# Patient Record
Sex: Female | Born: 1995 | Hispanic: No | Marital: Married | Smoking: Never smoker
Health system: Southern US, Community
[De-identification: ages and names within clinical notes are randomized; demographics above are authoritative.]

## PROBLEM LIST (undated history)

## (undated) DIAGNOSIS — L709 Acne, unspecified: Secondary | ICD-10-CM

## (undated) DIAGNOSIS — D649 Anemia, unspecified: Secondary | ICD-10-CM

## (undated) DIAGNOSIS — J45909 Unspecified asthma, uncomplicated: Secondary | ICD-10-CM

## (undated) DIAGNOSIS — F410 Panic disorder [episodic paroxysmal anxiety] without agoraphobia: Secondary | ICD-10-CM

## (undated) DIAGNOSIS — L309 Dermatitis, unspecified: Secondary | ICD-10-CM

## (undated) HISTORY — DX: Unspecified asthma, uncomplicated: J45.909

## (undated) HISTORY — DX: Panic disorder (episodic paroxysmal anxiety): F41.0

## (undated) HISTORY — DX: Dermatitis, unspecified: L30.9

## (undated) HISTORY — DX: Acne, unspecified: L70.9

---
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
## 2013-07-04 ENCOUNTER — Ambulatory Visit (INDEPENDENT_AMBULATORY_CARE_PROVIDER_SITE_OTHER): Payer: Managed Care, Other (non HMO) | Admitting: Family Medicine

## 2013-07-04 ENCOUNTER — Encounter: Payer: Self-pay | Admitting: Family Medicine

## 2013-07-04 VITALS — BP 115/72 | HR 101 | Temp 99.1°F | Ht 66.0 in | Wt 133.0 lb

## 2013-07-04 DIAGNOSIS — Z87898 Personal history of other specified conditions: Secondary | ICD-10-CM

## 2013-07-04 DIAGNOSIS — N390 Urinary tract infection, site not specified: Secondary | ICD-10-CM

## 2013-07-04 DIAGNOSIS — R350 Frequency of micturition: Secondary | ICD-10-CM

## 2013-07-04 LAB — POCT URINALYSIS DIPSTICK
Bilirubin, UA: NEGATIVE
Glucose, UA: NEGATIVE
Ketones, UA: NEGATIVE
Nitrite, UA: NEGATIVE
Protein, UA: NEGATIVE
Spec Grav, UA: 1.01
Urobilinogen, UA: NEGATIVE
pH, UA: 7

## 2013-07-04 LAB — POCT UA - MICROSCOPIC ONLY
Bacteria, U Microscopic: NEGATIVE
Casts, Ur, LPF, POC: NEGATIVE
Crystals, Ur, HPF, POC: NEGATIVE
Mucus, UA: NEGATIVE
Yeast, UA: NEGATIVE

## 2013-07-04 MED ORDER — CIPROFLOXACIN HCL 500 MG PO TABS: 500.0000 mg | ORAL_TABLET | Freq: Two times a day (BID) | ORAL | Status: DC

## 2013-07-04 NOTE — Progress Notes (Signed)
   Subjective:    Patient ID: Marcianne Swanick, female    DOB: [birth year], 18 y.o.   MRN: [id number]  HPI This 18 y.o. female presents for evaluation of urinary sx's for 2 days.   Review of Systems C/o dysuria   No chest pain, SOB, HA, dizziness, vision change, N/V, diarrhea, constipation,myalgias, arthralgias or rash.  Objective:   Physical Exam Vital signs noted  Well developed well nourished female.  HEENT - Head atraumatic Normocephalic Respiratory - Lungs CTA bilateral Cardiac - RRR S1 and S2 without murmur   Results for orders placed in visit on 07/04/13  POCT URINALYSIS DIPSTICK      Result Value Ref Range   Color, UA yellow     Clarity, UA clear     Glucose, UA neg     Bilirubin, UA neg     Ketones, UA neg     Spec Grav, UA 1.010     Blood, UA trace     pH, UA 7.0     Protein, UA neg     Urobilinogen, UA negative     Nitrite, UA neg     Leukocytes, UA Trace    POCT UA - MICROSCOPIC ONLY      Result Value Ref Range   WBC, Ur, HPF, POC 10-15     RBC, urine, microscopic 1-5     Bacteria, U Microscopic neg     Mucus, UA neg     Epithelial cells, urine per micros occ     Crystals, Ur, HPF, POC neg     Casts, Ur, LPF, POC neg     Yeast, UA neg         Assessment & Plan:  Urinary frequency - Plan: POCT urinalysis dipstick, POCT UA - Microscopic Only, Urine culture, ciprofloxacin (CIPRO) 500 MG tablet  History of painful urination - Plan: POCT urinalysis dipstick, POCT UA - Microscopic Only, Urine culture, ciprofloxacin (CIPRO) 500 MG tablet  UTI (urinary tract infection) - Plan: ciprofloxacin (CIPRO) 500 MG tablet  Push po fluids, rest, tylenol and motrin otc prn as directed for fever, arthralgias, and myalgias.  Follow up prn if sx's continue or persist.  Deatra Canter FNP

## 2013-07-06 LAB — URINE CULTURE

## 2013-09-03 ENCOUNTER — Encounter: Payer: Self-pay | Admitting: Nurse Practitioner

## 2013-09-03 ENCOUNTER — Ambulatory Visit (INDEPENDENT_AMBULATORY_CARE_PROVIDER_SITE_OTHER): Payer: Managed Care, Other (non HMO) | Admitting: Nurse Practitioner

## 2013-09-03 ENCOUNTER — Telehealth: Payer: Self-pay | Admitting: Family Medicine

## 2013-09-03 VITALS — BP 127/82 | HR 98 | Temp 98.6°F | Ht 66.0 in | Wt 132.8 lb

## 2013-09-03 DIAGNOSIS — J029 Acute pharyngitis, unspecified: Secondary | ICD-10-CM

## 2013-09-03 LAB — POCT RAPID STREP A (OFFICE): RAPID STREP A SCREEN: NEGATIVE

## 2013-09-03 MED ORDER — AMOXICILLIN 875 MG PO TABS: 875.0000 mg | ORAL_TABLET | Freq: Two times a day (BID) | ORAL | Status: DC

## 2013-09-03 NOTE — Patient Instructions (Signed)
Force fluids °Motrin or tylenol OTC °OTC decongestant °Throat lozenges if help °New toothbrush in 3 days ° °

## 2013-09-03 NOTE — Telephone Encounter (Signed)
Appt given for today per patients request 

## 2013-09-03 NOTE — Progress Notes (Signed)
   Subjective:    Patient ID: Julie Zimmerman, female    DOB: 03-Sep-1995, 18 y.o.   MRN: [id number]  HPI  Patient in C/O headache that started yesterday afternoon- then she developed body aches and sore throat last night.    Review of Systems  Constitutional: Positive for fever (last night).  HENT: Positive for sore throat, trouble swallowing and voice change.   Respiratory: Negative for cough.   Cardiovascular: Negative.   Gastrointestinal: Positive for nausea.  Musculoskeletal: Negative.   Neurological: Positive for headaches.       Objective:   Physical Exam  Constitutional: She is oriented to person, place, and time. She appears well-developed and well-nourished. No distress.  HENT:  Right Ear: Hearing, tympanic membrane, external ear and ear canal normal.  Left Ear: Hearing, tympanic membrane, external ear and ear canal normal.  Nose: Mucosal edema and rhinorrhea present. Right sinus exhibits no maxillary sinus tenderness and no frontal sinus tenderness. Left sinus exhibits no maxillary sinus tenderness and no frontal sinus tenderness.  Mouth/Throat: Uvula is midline and mucous membranes are normal. Posterior oropharyngeal edema and posterior oropharyngeal erythema (mild) present.  Neck: Normal range of motion.  Cardiovascular: Normal rate, regular rhythm and normal heart sounds.   Pulmonary/Chest: Effort normal and breath sounds normal.  Lymphadenopathy:    She has no cervical adenopathy.  Neurological: She is alert and oriented to person, place, and time.  Skin: Skin is warm and dry.  Psychiatric: She has a normal mood and affect. Her behavior is normal. Judgment and thought content normal.   BP 127/82  Pulse 98  Temp(Src) 98.6 F (37 C) (Oral)  Ht 5\' 6"  (1.676 m)  Wt 132 lb 12.8 oz (60.238 kg)  BMI 21.44 kg/m2  Results for orders placed in visit on 09/03/13  POCT RAPID STREP A (OFFICE)      Result Value Ref Range   Rapid Strep A Screen Negative  Negative           Assessment & Plan:   1. Sore throat   2. Acute pharyngitis, unspecified pharyngitis type    Meds ordered this encounter  Medications  . amoxicillin (AMOXIL) 875 MG tablet    Sig: Take 1 tablet (875 mg total) by mouth 2 (two) times daily.    Dispense:  20 tablet    Refill:  0    Order Specific Question:  Supervising Provider    Answer:  Deborra MedinaMOORE, DONALD W [[id number]]   Force fluids Motrin or tylenol OTC OTC decongestant Throat lozenges if help New toothbrush in 3 days  Mary-Margaret Daphine DeutscherMartin, FNP

## 2013-11-10 ENCOUNTER — Other Ambulatory Visit: Payer: Self-pay | Admitting: Obstetrics & Gynecology

## 2013-12-08 ENCOUNTER — Encounter: Payer: Self-pay | Admitting: Nurse Practitioner

## 2013-12-09 ENCOUNTER — Ambulatory Visit (INDEPENDENT_AMBULATORY_CARE_PROVIDER_SITE_OTHER): Payer: Managed Care, Other (non HMO) | Admitting: Obstetrics & Gynecology

## 2013-12-09 ENCOUNTER — Encounter: Payer: Self-pay | Admitting: Obstetrics & Gynecology

## 2013-12-09 VITALS — BP 122/80 | Ht 67.0 in | Wt 134.0 lb

## 2013-12-09 DIAGNOSIS — L659 Nonscarring hair loss, unspecified: Secondary | ICD-10-CM

## 2013-12-09 DIAGNOSIS — Z01419 Encounter for gynecological examination (general) (routine) without abnormal findings: Secondary | ICD-10-CM

## 2013-12-09 NOTE — Progress Notes (Signed)
Patient ID: Julie Zimmerman, female   DOB: 1995-07-04, 18 y.o.   MRN: [id number] Blood pressure 122/80, height 5\' 7"  (1.702 m), weight 134 lb (60.782 kg), last menstrual period 12/09/2013. Pt with increased hair loss over the past 6-8 months Dramatically changed in that time Has been on this progesterone for some time No hair products issue or straightening  No hot cold intolerance Increased amount of life stressors over the past 6 months or so  Will check TSH have her stop her OCP for now to see response  increased biotin and MVI

## 2013-12-10 LAB — TSH: TSH: 1.052 u[IU]/mL (ref 0.350–4.500)

## 2014-05-26 ENCOUNTER — Ambulatory Visit (INDEPENDENT_AMBULATORY_CARE_PROVIDER_SITE_OTHER): Payer: Managed Care, Other (non HMO) | Admitting: Obstetrics & Gynecology

## 2014-05-26 ENCOUNTER — Encounter: Payer: Self-pay | Admitting: Obstetrics & Gynecology

## 2014-05-26 VITALS — BP 110/70 | HR 72 | Wt 139.0 lb

## 2014-05-26 DIAGNOSIS — L659 Nonscarring hair loss, unspecified: Secondary | ICD-10-CM | POA: Diagnosis not present

## 2014-05-26 MED ORDER — DESOGESTREL-ETHINYL ESTRADIOL 0.15-30 MG-MCG PO TABS: 1.0000 | ORAL_TABLET | Freq: Every day | ORAL | Status: DC

## 2014-05-26 NOTE — Progress Notes (Signed)
Patient ID: Genia Haroldllison Naraine, female   DOB: [birth year], 19 y.o.   MRN: [id number] Pt still having issues with excessive hair loss, stopped OCP which has not been of help TSH was normal, reviewed Taking biotin and a MVI as prescribed  Acne worse off OCP, wants to restart  Exam Blood pressure 110/70, pulse 72, weight 139 lb (63.05 kg), last menstrual period 05/17/2014.  Hair no lesions or skin changes mild dandruff changes Back with mild acne  Will restart her desogestrel with 30 micrograms EE  Follow up for yearly as scheduled     Face to face time:  15 minutes  Greater than 50% of the visit time was spent in counseling and coordination of care with the patient.  The summary and outline of the counseling and care coordination is summarized in the note above.   All questions were answered.

## 2014-06-25 ENCOUNTER — Telehealth: Payer: Self-pay | Admitting: Obstetrics & Gynecology

## 2014-06-25 NOTE — Telephone Encounter (Signed)
Pt states been on OCP x 2 weeks now having vaginal bleeding. Informed pt that you can have irregular bleeding anytime you start a new birth control or switch birth control. Pt states "really concerned she might be pregnant."  Pt advised to take an OTC pregnancy test if positive pt can [facility]. Pt verbalized understanding.

## 2014-09-07 ENCOUNTER — Other Ambulatory Visit: Payer: Managed Care, Other (non HMO) | Admitting: Adult Health

## 2015-05-03 ENCOUNTER — Other Ambulatory Visit (HOSPITAL_COMMUNITY)
Admission: RE | Admit: 2015-05-03 | Discharge: 2015-05-03 | Disposition: A | Discharge status: Home / Self Care | Payer: Managed Care, Other (non HMO) | Source: Ambulatory Visit | Attending: Obstetrics & Gynecology | Admitting: Obstetrics & Gynecology

## 2015-05-03 ENCOUNTER — Ambulatory Visit (INDEPENDENT_AMBULATORY_CARE_PROVIDER_SITE_OTHER): Payer: Managed Care, Other (non HMO) | Admitting: Obstetrics & Gynecology

## 2015-05-03 ENCOUNTER — Encounter: Payer: Self-pay | Admitting: Obstetrics & Gynecology

## 2015-05-03 VITALS — BP 150/92 | HR 86 | Ht 65.5 in | Wt 129.5 lb

## 2015-05-03 DIAGNOSIS — N390 Urinary tract infection, site not specified: Secondary | ICD-10-CM | POA: Diagnosis not present

## 2015-05-03 DIAGNOSIS — R300 Dysuria: Secondary | ICD-10-CM

## 2015-05-03 DIAGNOSIS — N941 Unspecified dyspareunia: Secondary | ICD-10-CM

## 2015-05-03 DIAGNOSIS — N760 Acute vaginitis: Secondary | ICD-10-CM

## 2015-05-03 DIAGNOSIS — Z01419 Encounter for gynecological examination (general) (routine) without abnormal findings: Secondary | ICD-10-CM

## 2015-05-03 DIAGNOSIS — B9689 Other specified bacterial agents as the cause of diseases classified elsewhere: Secondary | ICD-10-CM

## 2015-05-03 DIAGNOSIS — N942 Vaginismus: Secondary | ICD-10-CM

## 2015-05-03 LAB — POCT URINALYSIS DIPSTICK
Glucose, UA: NEGATIVE
Ketones, UA: NEGATIVE
Nitrite, UA: NEGATIVE
PROTEIN UA: NEGATIVE

## 2015-05-03 MED ORDER — METRONIDAZOLE 0.75 % VA GEL: VAGINAL | Status: DC

## 2015-05-03 MED ORDER — SULFAMETHOXAZOLE-TRIMETHOPRIM 800-160 MG PO TABS: 1.0000 | ORAL_TABLET | Freq: Two times a day (BID) | ORAL | Status: DC

## 2015-05-03 MED ORDER — NORETHIN ACE-ETH ESTRAD-FE 1-20 MG-MCG(24) PO TABS: 1.0000 | ORAL_TABLET | Freq: Every day | ORAL | Status: DC

## 2015-05-03 NOTE — Progress Notes (Signed)
Patient ID: Julie Zimmerman, female   DOB: 14-Jan-1996, 20 y.o.   MRN: [id number] Subjective:     Julie Haroldllison Laing is a 20 y.o. female here for a routine exam.  Patient's last menstrual period was 04/28/2015. G1P1001 Birth Control Method:  none Menstrual Calendar(currently): none  Current complaints: dysuria, vaginal odor.   Current acute medical issues:  none   Recent Gynecologic History Patient's last menstrual period was 04/28/2015. Last Pap: never,   Last mammogram: ,    Past Medical History  Diagnosis Date  . Asthma   . Eczema   . Acne     Past Surgical History  Procedure Laterality Date  . Cesarean section  11/05/2010    Procedure: CESAREAN SECTION;  Surgeon: Hollie SalkMyra C. Marice Potterove, MD;  Location: [facility];  Service: Gynecology;  Laterality: N/A;  Primary cesarean section with delivery of baby boy at [id number]. Apgars 6/9.    OB History    Gravida Para Term Preterm AB TAB SAB Ectopic Multiple Living   1 1 1       1       Social History   Social History  . Marital Status: Married    Spouse Name: N/A  . Number of Children: N/A  . Years of Education: N/A   Social History Main Topics  . Smoking status: Never Smoker   . Smokeless tobacco: Never Used  . Alcohol Use: No  . Drug Use: No  . Sexual Activity: Yes    Birth Control/ Protection: None   Other Topics Concern  . None   Social History Narrative    Family History  Problem Relation Age of Onset  . Cancer Other     great gm breast  . Heart disease Father   . Cancer Paternal Uncle     colon  . Heart disease Maternal Grandfather   . Stroke Paternal Grandfather   . Other Maternal Grandmother     died in MVA     Current outpatient prescriptions:  .  Adapalene 0.3 % gel, Apply topically at bedtime. , Disp: , Rfl:  .  Azelaic Acid (FINACEA) 15 % cream, Apply topically at bedtime. After skin is thoroughly washed and patted dry, gently but thoroughly massage a thin film of azelaic acid cream into the affected area twice  daily, in the morning and evening., Disp: , Rfl:  .  tacrolimus (PROTOPIC) 0.1 % ointment, Apply topically as needed. , Disp: , Rfl:  .  metroNIDAZOLE (METROGEL VAGINAL) 0.75 % vaginal gel, Nightly x 5 nights, Disp: 70 g, Rfl: 0 .  sulfamethoxazole-trimethoprim (BACTRIM DS) 800-160 MG tablet, Take 1 tablet by mouth 2 (two) times daily., Disp: 14 tablet, Rfl: 0  Review of Systems  Review of Systems  Constitutional: Negative for fever, chills, weight loss, malaise/fatigue and diaphoresis.  HENT: Negative for hearing loss, ear pain, nosebleeds, congestion, sore throat, neck pain, tinnitus and ear discharge.   Eyes: Negative for blurred vision, double vision, photophobia, pain, discharge and redness.  Respiratory: Negative for cough, hemoptysis, sputum production, shortness of breath, wheezing and stridor.   Cardiovascular: Negative for chest pain, palpitations, orthopnea, claudication, leg swelling and PND.  Gastrointestinal: negative for abdominal pain. Negative for heartburn, nausea, vomiting, diarrhea, constipation, blood in stool and melena.  Genitourinary: Negative for dysuria, urgency, frequency, hematuria and flank pain.  Musculoskeletal: Negative for myalgias, back pain, joint pain and falls.  Skin: Negative for itching and rash.  Neurological: Negative for dizziness, tingling, tremors, sensory change, speech change, focal weakness, seizures,  loss of consciousness, weakness and headaches.  Endo/Heme/Allergies: Negative for environmental allergies and polydipsia. Does not bruise/bleed easily.  Psychiatric/Behavioral: Negative for depression, suicidal ideas, hallucinations, memory loss and substance abuse. The patient is not nervous/anxious and does not have insomnia.        Objective:  Blood pressure 150/92, pulse 86, height 5' 5.5" (1.664 m), weight 129 lb 8 oz (58.741 kg), last menstrual period 04/28/2015.   Physical Exam  Vitals reviewed. Constitutional: She is oriented to person,  place, and time. She appears well-developed and well-nourished.  HENT:  Head: Normocephalic and atraumatic.        Right Ear: External ear normal.  Left Ear: External ear normal.  Nose: Nose normal.  Mouth/Throat: Oropharynx is clear and moist.  Eyes: Conjunctivae and EOM are normal. Pupils are equal, round, and reactive to light. Right eye exhibits no discharge. Left eye exhibits no discharge. No scleral icterus.  Neck: Normal range of motion. Neck supple. No tracheal deviation present. No thyromegaly present.  Cardiovascular: Normal rate, regular rhythm, normal heart sounds and intact distal pulses.  Exam reveals no gallop and no friction rub.   No murmur heard. Respiratory: Effort normal and breath sounds normal. No respiratory distress. She has no wheezes. She has no rales. She exhibits no tenderness.  GI: Soft. Bowel sounds are normal. She exhibits no distension and no mass. There is no tenderness. There is no rebound and no guarding.  Genitourinary:  Breasts no masses skin changes or nipple changes bilaterally      Vulva is normal without lesions Vagina is pink moist without discharge Cervix normal in appearance and pap is done Uterus is normal size shape and contour Adnexa is negative with normal sized ovaries   Musculoskeletal: Normal range of motion. She exhibits no edema and no tenderness.  Neurological: She is alert and oriented to person, place, and time. She has normal reflexes. She displays normal reflexes. No cranial nerve deficit. She exhibits normal muscle tone. Coordination normal.  Skin: Skin is warm and dry. No rash noted. No erythema. No pallor.  Psychiatric: She has a normal mood and affect. Her behavior is normal. Judgment and thought content normal.       Assessment:    Healthy female exam.    Plan:    Contraception: none. Follow up in: 3 weeks. re evaluate BV and UTI    Meds ordered this encounter  Medications  . Adapalene 0.3 % gel    Sig: Apply  topically at bedtime.   . tacrolimus (PROTOPIC) 0.1 % ointment    Sig: Apply topically as needed.   . sulfamethoxazole-trimethoprim (BACTRIM DS) 800-160 MG tablet    Sig: Take 1 tablet by mouth 2 (two) times daily.    Dispense:  14 tablet    Refill:  0  . metroNIDAZOLE (METROGEL VAGINAL) 0.75 % vaginal gel    Sig: Nightly x 5 nights    Dispense:  70 g    Refill:  0    Orders Placed This Encounter  Procedures  . Urine culture  . POCT Urinalysis Dipstick

## 2015-05-04 LAB — CYTOLOGY - PAP

## 2015-05-05 LAB — URINE CULTURE

## 2015-05-24 ENCOUNTER — Ambulatory Visit: Payer: Managed Care, Other (non HMO) | Admitting: Obstetrics & Gynecology

## 2015-05-31 ENCOUNTER — Ambulatory Visit (INDEPENDENT_AMBULATORY_CARE_PROVIDER_SITE_OTHER): Payer: Managed Care, Other (non HMO) | Admitting: Obstetrics & Gynecology

## 2015-05-31 ENCOUNTER — Encounter: Payer: Self-pay | Admitting: Obstetrics & Gynecology

## 2015-05-31 VITALS — BP 110/70 | HR 84 | Ht 65.2 in | Wt 136.3 lb

## 2015-05-31 DIAGNOSIS — R809 Proteinuria, unspecified: Secondary | ICD-10-CM

## 2015-05-31 DIAGNOSIS — A499 Bacterial infection, unspecified: Secondary | ICD-10-CM

## 2015-05-31 DIAGNOSIS — N760 Acute vaginitis: Secondary | ICD-10-CM

## 2015-05-31 DIAGNOSIS — B9689 Other specified bacterial agents as the cause of diseases classified elsewhere: Secondary | ICD-10-CM

## 2015-05-31 LAB — POCT URINALYSIS DIPSTICK
Glucose, UA: NEGATIVE
KETONES UA: NEGATIVE
LEUKOCYTES UA: NEGATIVE
Nitrite, UA: NEGATIVE
RBC UA: NEGATIVE

## 2015-05-31 NOTE — Progress Notes (Signed)
Patient ID: Julie Zimmerman, female   DOB: 1995/10/09, 20 y.o.   MRN: [id number] Follow up appointment for results  Chief Complaint  Patient presents with  . Follow-up    treated UTI    Blood pressure 110/70, pulse 84, height 5' 5.2" (1.656 m), weight 136 lb 4.8 oz (61.825 kg), last menstrual period 05/26/2015.  Pt in for follow up from her appointment 3 weeks ago with BV and UTI Took the bactrim DS and metro gel with complete resolution of her symptoms Also her acne is muchh better on the adapalene 0.3%  Overall reolution and good management of all of her symp[toms  MEDS ordered this encounter: No orders of the defined types were placed in this encounter.    Orders for this encounter: Orders Placed This Encounter  Procedures  . POCT urinalysis dipstick    Plan:  Follow Up:     Face to face time:  10 minutes  Greater than 50% of the visit time was spent in counseling and coordination of care with the patient.  The summary and outline of the counseling and care coordination is summarized in the note above.   All questions were answered.  Past Medical History  Diagnosis Date  . Asthma   . Eczema   . Acne     Past Surgical History  Procedure Laterality Date  . Cesarean section  11/05/2010    Procedure: CESAREAN SECTION;  Surgeon: Hollie SalkMyra C. Marice Potterove, MD;  Location: [facility];  Service: Gynecology;  Laterality: N/A;  Primary cesarean section with delivery of baby boy at [id number]. Apgars 6/9.    OB History    Gravida Para Term Preterm AB TAB SAB Ectopic Multiple Living   1 1 1       1       Allergies  Allergen Reactions  . Scopolamine     Social History   Social History  . Marital Status: Married    Spouse Name: N/A  . Number of Children: N/A  . Years of Education: N/A   Social History Main Topics  . Smoking status: Never Smoker   . Smokeless tobacco: Never Used  . Alcohol Use: No  . Drug Use: No  . Sexual Activity: Yes    Birth Control/ Protection: None    Other Topics Concern  . None   Social History Narrative    Family History  Problem Relation Age of Onset  . Cancer Other     great gm breast  . Heart disease Father   . Cancer Paternal Uncle     colon  . Heart disease Maternal Grandfather   . Stroke Paternal Grandfather   . Other Maternal Grandmother     died in MVA

## 2016-05-29 ENCOUNTER — Other Ambulatory Visit: Payer: Self-pay | Admitting: Obstetrics & Gynecology

## 2016-06-06 ENCOUNTER — Ambulatory Visit: Payer: Managed Care, Other (non HMO) | Admitting: Obstetrics & Gynecology

## 2016-06-19 ENCOUNTER — Ambulatory Visit (INDEPENDENT_AMBULATORY_CARE_PROVIDER_SITE_OTHER): Payer: Managed Care, Other (non HMO) | Admitting: Obstetrics & Gynecology

## 2016-06-19 ENCOUNTER — Encounter: Payer: Self-pay | Admitting: Obstetrics & Gynecology

## 2016-06-19 ENCOUNTER — Encounter (INDEPENDENT_AMBULATORY_CARE_PROVIDER_SITE_OTHER): Payer: Self-pay

## 2016-06-19 VITALS — BP 124/86 | HR 69 | Wt 138.0 lb

## 2016-06-19 DIAGNOSIS — Z3041 Encounter for surveillance of contraceptive pills: Secondary | ICD-10-CM | POA: Diagnosis not present

## 2016-06-19 MED ORDER — NORETHIN ACE-ETH ESTRAD-FE 1-20 MG-MCG PO TABS: 1.0000 | ORAL_TABLET | Freq: Every day | ORAL | 12 refills | Status: DC
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
## 2016-10-13 ENCOUNTER — Ambulatory Visit: Payer: Managed Care, Other (non HMO) | Admitting: Adult Health

## 2016-10-26 ENCOUNTER — Ambulatory Visit: Payer: 59 | Admitting: Family Medicine

## 2016-10-27 ENCOUNTER — Encounter: Payer: Self-pay | Admitting: Family

## 2016-10-27 ENCOUNTER — Ambulatory Visit (INDEPENDENT_AMBULATORY_CARE_PROVIDER_SITE_OTHER): Payer: 59 | Admitting: Family

## 2016-10-27 VITALS — BP 131/76 | HR 80 | Temp 99.7°F | Ht 65.0 in | Wt 139.4 lb

## 2016-10-27 DIAGNOSIS — Z3A09 9 weeks gestation of pregnancy: Secondary | ICD-10-CM

## 2016-10-27 DIAGNOSIS — Z3201 Encounter for pregnancy test, result positive: Secondary | ICD-10-CM | POA: Diagnosis not present

## 2016-10-27 DIAGNOSIS — N912 Amenorrhea, unspecified: Secondary | ICD-10-CM

## 2016-10-27 LAB — PREGNANCY, URINE: Preg Test, Ur: POSITIVE — AB

## 2016-10-27 NOTE — Progress Notes (Signed)
   Subjective:    Patient ID: Julie Zimmerman, female    DOB: January 28, 1996, 21 y.o.   MRN: [id number]   HPI Pt presents to the office today to establish care and referral to OB/GYN. PT states her last menstrual cycle was around 08/20/16 and had one day of mild spotting around 09/11/16. Pt has positive pregnancy at home on 09/25/16.    Review of Systems  All other systems reviewed and are negative.   Family History  Problem Relation Age of Onset  . Cancer Other        great gm breast  . Heart disease Father   . Cancer Paternal Uncle        colon  . Heart disease Maternal Grandfather   . Stroke Paternal Grandfather   . Other Maternal Grandmother        died in MVA    Social History   Social History  . Marital status: Married    Spouse name: N/A  . Number of children: N/A  . Years of education: N/A   Social History Main Topics  . Smoking status: Never Smoker  . Smokeless tobacco: Never Used  . Alcohol use No  . Drug use: No  . Sexual activity: Yes    Birth control/ protection: Pill   Other Topics Concern  . None   Social History Narrative  . None       Objective:   Physical Exam  Constitutional: She is oriented to person, place, and time. She appears well-developed and well-nourished. No distress.  HENT:  Head: Normocephalic and atraumatic.  Right Ear: External ear normal.  Left Ear: External ear normal.  Nose: Nose normal.  Mouth/Throat: Oropharynx is clear and moist.  Eyes: Pupils are equal, round, and reactive to light.  Neck: Normal range of motion. Neck supple. No thyromegaly present.  Cardiovascular: Normal rate, regular rhythm and intact distal pulses.   Murmur heard. Pulmonary/Chest: Effort normal and breath sounds normal. No respiratory distress. She has no wheezes.  Abdominal: Soft. Bowel sounds are normal. She exhibits no distension. There is no tenderness.  Musculoskeletal: Normal range of motion. She exhibits no edema or tenderness.    Neurological: She is alert and oriented to person, place, and time.  Skin: Skin is warm and dry.  Psychiatric: She has a normal mood and affect. Her behavior is normal. Judgment and thought content normal.  Vitals reviewed.     BP 131/76   Pulse 80   Temp 99.7 F (37.6 C) (Oral)   Ht  (1.651 m)   Wt 139 lb 6.4 oz (63.2 kg)   BMI 23.20 kg/m      Assessment & Plan:  1. Amenorrhea - Pregnancy, urine - Ambulatory referral to Obstetrics / Gynecology  2. Positive pregnancy test - Ambulatory referral to Obstetrics / Gynecology  Referral placed for OB/GYn No OTC Force fluids, importance of staying hydrated Start prenatals   Jannifer Rodney, [place]

## 2016-10-27 NOTE — Patient Instructions (Addendum)
About 9 weeks and 5 days, approx due date: May 27, 2017    First Trimester of Pregnancy The first trimester of pregnancy is from week 1 until the end of week 13 (months 1 through 3). A week after a sperm fertilizes an egg, the egg will implant on the wall of the uterus. This embryo will begin to develop into a baby. Genes from you and your partner will form the baby. The female genes will determine whether the baby will be a boy or a girl. At 6-8 weeks, the eyes and face will be formed, and the heartbeat can be seen on ultrasound. At the end of 12 weeks, all the baby's organs will be formed. Now that you are pregnant, you will want to do everything you can to have a healthy baby. Two of the most important things are to get good prenatal care and to follow your health care provider's instructions. Prenatal care is all the medical care you receive before the baby's birth. This care will help prevent, find, and treat any problems during the pregnancy and childbirth. Body changes during your first trimester Your body goes through many changes during pregnancy. The changes vary from woman to woman.  You may gain or lose a couple of pounds at first.  You may feel sick to your stomach (nauseous) and you may throw up (vomit). If the vomiting is uncontrollable, call your health care provider.  You may tire easily.  You may develop headaches that can be relieved by medicines. All medicines should be approved by your health care provider.  You may urinate more often. Painful urination may mean you have a bladder infection.  You may develop heartburn as a result of your pregnancy.  You may develop constipation because certain hormones are causing the muscles that push stool through your intestines to slow down.  You may develop hemorrhoids or swollen veins (varicose veins).  Your breasts may begin to grow larger and become tender. Your nipples may stick out more, and the tissue that surrounds them  (areola) may become darker.  Your gums may bleed and may be sensitive to brushing and flossing.  Dark spots or blotches (chloasma, mask of pregnancy) may develop on your face. This will likely fade after the baby is born.  Your menstrual periods will stop.  You may have a loss of appetite.  You may develop cravings for certain kinds of food.  You may have changes in your emotions from day to day, such as being excited to be pregnant or being concerned that something may go wrong with the pregnancy and baby.  You may have more vivid and strange dreams.  You may have changes in your hair. These can include thickening of your hair, rapid growth, and changes in texture. Some women also have hair loss during or after pregnancy, or hair that feels dry or thin. Your hair will most likely return to normal after your baby is born.  What to expect at prenatal visits During a routine prenatal visit:  You will be weighed to make sure you and the baby are growing normally.  Your blood pressure will be taken.  Your abdomen will be measured to track your baby's growth.  The fetal heartbeat will be listened to between weeks 10 and 14 of your pregnancy.  Test results from any previous visits will be discussed.  Your health care provider may ask you:  How you are feeling.  If you are feeling the baby move.  If you have had any abnormal symptoms, such as leaking fluid, bleeding, severe headaches, or abdominal cramping.  If you are using any tobacco products, including cigarettes, chewing tobacco, and electronic cigarettes.  If you have any questions.  Other tests that may be performed during your first trimester include:  Blood tests to find your blood type and to check for the presence of any previous infections. The tests will also be used to check for low iron levels (anemia) and protein on red blood cells (Rh antibodies). Depending on your risk factors, or if you previously had diabetes  during pregnancy, you may have tests to check for high blood sugar that affects pregnant women (gestational diabetes).  Urine tests to check for infections, diabetes, or protein in the urine.  An ultrasound to confirm the proper growth and development of the baby.  Fetal screens for spinal cord problems (spina bifida) and Down syndrome.  HIV (human immunodeficiency virus) testing. Routine prenatal testing includes screening for HIV, unless you choose not to have this test.  You may need other tests to make sure you and the baby are doing well.  Follow these instructions at home: Medicines  Follow your health care provider's instructions regarding medicine use. Specific medicines may be either safe or unsafe to take during pregnancy.  Take a prenatal vitamin that contains at least 600 micrograms (mcg) of folic acid.  If you develop constipation, try taking a stool softener if your health care provider approves. Eating and drinking  Eat a balanced diet that includes fresh fruits and vegetables, whole grains, good sources of protein such as meat, eggs, or tofu, and low-fat dairy. Your health care provider will help you determine the amount of weight gain that is right for you.  Avoid raw meat and uncooked cheese. These carry germs that can cause birth defects in the baby.  Eating four or five small meals rather than three large meals a day may help relieve nausea and vomiting. If you start to feel nauseous, eating a few soda crackers can be helpful. Drinking liquids between meals, instead of during meals, also seems to help ease nausea and vomiting.  Limit foods that are high in fat and processed sugars, such as fried and sweet foods.  To prevent constipation: ? Eat foods that are high in fiber, such as fresh fruits and vegetables, whole grains, and beans. ? Drink enough fluid to keep your urine clear or pale yellow. Activity  Exercise only as directed by your health care provider.  Most women can continue their usual exercise routine during pregnancy. Try to exercise for 30 minutes at least 5 days a week. Exercising will help you: ? Control your weight. ? Stay in shape. ? Be prepared for labor and delivery.  Experiencing pain or cramping in the lower abdomen or lower back is a good sign that you should stop exercising. Check with your health care provider before continuing with normal exercises.  Try to avoid standing for long periods of time. Move your legs often if you must stand in one place for a long time.  Avoid heavy lifting.  Wear low-heeled shoes and practice good posture.  You may continue to have sex unless your health care provider tells you not to. Relieving pain and discomfort  Wear a good support bra to relieve breast tenderness.  Take warm sitz baths to soothe any pain or discomfort caused by hemorrhoids. Use hemorrhoid cream if your health care provider approves.  Rest with your legs  elevated if you have leg cramps or low back pain.  If you develop varicose veins in your legs, wear support hose. Elevate your feet for 15 minutes, 3-4 times a day. Limit salt in your diet. Prenatal care  Schedule your prenatal visits by the twelfth week of pregnancy. They are usually scheduled monthly at first, then more often in the last 2 months before delivery.  Write down your questions. Take them to your prenatal visits.  Keep all your prenatal visits as told by your health care provider. This is important. Safety  Wear your seat belt at all times when driving.  Make a list of emergency phone numbers, including numbers for family, friends, the hospital, and police and fire departments. General instructions  Ask your health care provider for a referral to a local prenatal education class. Begin classes no later than the beginning of month 6 of your pregnancy.  Ask for help if you have counseling or nutritional needs during pregnancy. Your health care  provider can offer advice or refer you to specialists for help with various needs.  Do not use hot tubs, steam rooms, or saunas.  Do not douche or use tampons or scented sanitary pads.  Do not cross your legs for long periods of time.  Avoid cat litter boxes and soil used by cats. These carry germs that can cause birth defects in the baby and possibly loss of the fetus by miscarriage or stillbirth.  Avoid all smoking, herbs, alcohol, and medicines not prescribed by your health care provider. Chemicals in these products affect the formation and growth of the baby.  Do not use any products that contain nicotine or tobacco, such as cigarettes and e-cigarettes. If you need help quitting, ask your health care provider. You may receive counseling support and other resources to help you quit.  Schedule a dentist appointment. At home, brush your teeth with a soft toothbrush and be gentle when you floss. Contact a health care provider if:  You have dizziness.  You have mild pelvic cramps, pelvic pressure, or nagging pain in the abdominal area.  You have persistent nausea, vomiting, or diarrhea.  You have a bad smelling vaginal discharge.  You have pain when you urinate.  You notice increased swelling in your face, hands, legs, or ankles.  You are exposed to fifth disease or chickenpox.  You are exposed to [place] measles (rubella) and have never had it. Get help right away if:  You have a fever.  You are leaking fluid from your vagina.  You have spotting or bleeding from your vagina.  You have severe abdominal cramping or pain.  You have rapid weight gain or loss.  You vomit blood or material that looks like coffee grounds.  You develop a severe headache.  You have shortness of breath.  You have any kind of trauma, such as from a fall or a car accident. Summary  The first trimester of pregnancy is from week 1 until the end of week 13 (months 1 through 3).  Your body goes  through many changes during pregnancy. The changes vary from woman to woman.  You will have routine prenatal visits. During those visits, your health care provider will examine you, discuss any test results you may have, and talk with you about how you are feeling. This information is not intended to replace advice given to you by your health care provider. Make sure you discuss any questions you have with your health care provider. Document Released:  01/17/2001 Document Revised: 01/05/2016 Document Reviewed: 01/05/2016 Elsevier Interactive Patient Education  2017 [employer].

## 2016-10-30 ENCOUNTER — Other Ambulatory Visit: Payer: Self-pay | Admitting: Obstetrics and Gynecology

## 2016-10-30 DIAGNOSIS — O3680X0 Pregnancy with inconclusive fetal viability, not applicable or unspecified: Secondary | ICD-10-CM

## 2016-11-03 ENCOUNTER — Other Ambulatory Visit: Payer: Self-pay | Admitting: Obstetrics and Gynecology

## 2016-11-03 ENCOUNTER — Other Ambulatory Visit: Payer: 59

## 2016-11-03 ENCOUNTER — Ambulatory Visit (INDEPENDENT_AMBULATORY_CARE_PROVIDER_SITE_OTHER): Payer: 59

## 2016-11-03 DIAGNOSIS — O3680X0 Pregnancy with inconclusive fetal viability, not applicable or unspecified: Secondary | ICD-10-CM | POA: Diagnosis not present

## 2016-11-03 DIAGNOSIS — Z3682 Encounter for antenatal screening for nuchal translucency: Secondary | ICD-10-CM

## 2016-11-03 DIAGNOSIS — Z3A12 12 weeks gestation of pregnancy: Secondary | ICD-10-CM

## 2016-11-03 NOTE — Progress Notes (Signed)
[place] 12+2 wks,single IUP,normal ovaries bilat,NB present,NT 1.2 mm,crl 58.78 mm,EDD 05/16/2016 by [place]

## 2016-11-07 LAB — INTEGRATED 1
CROWN RUMP LENGTH MAT SCREEN: 58.8 mm
Gest. Age on Collection Date: 12.4 weeks
MATERNAL AGE AT EDD: 24 a
NUMBER OF FETUSES: 1
Nuchal Translucency (NT): 1.2 mm
PAPP-A Value: 941.5 ng/mL
WEIGHT: 135 [lb_av]

## 2016-11-24 ENCOUNTER — Ambulatory Visit: Payer: 59 | Admitting: *Deleted

## 2016-11-24 ENCOUNTER — Encounter: Payer: 59 | Admitting: Adult Health

## 2016-11-24 ENCOUNTER — Encounter: Payer: Self-pay | Admitting: Women's Health

## 2016-11-24 ENCOUNTER — Ambulatory Visit (INDEPENDENT_AMBULATORY_CARE_PROVIDER_SITE_OTHER): Payer: 59 | Admitting: Women's Health

## 2016-11-24 VITALS — BP 110/78 | HR 65 | Wt 136.5 lb

## 2016-11-24 DIAGNOSIS — F410 Panic disorder [episodic paroxysmal anxiety] without agoraphobia: Secondary | ICD-10-CM | POA: Insufficient documentation

## 2016-11-24 DIAGNOSIS — Z363 Encounter for antenatal screening for malformations: Secondary | ICD-10-CM

## 2016-11-24 DIAGNOSIS — O99342 Other mental disorders complicating pregnancy, second trimester: Secondary | ICD-10-CM

## 2016-11-24 DIAGNOSIS — Z3490 Encounter for supervision of normal pregnancy, unspecified, unspecified trimester: Secondary | ICD-10-CM | POA: Insufficient documentation

## 2016-11-24 DIAGNOSIS — Z3482 Encounter for supervision of other normal pregnancy, second trimester: Secondary | ICD-10-CM

## 2016-11-24 DIAGNOSIS — O99112 Other diseases of the blood and blood-forming organs and certain disorders involving the immune mechanism complicating pregnancy, second trimester: Secondary | ICD-10-CM

## 2016-11-24 DIAGNOSIS — D751 Secondary polycythemia: Secondary | ICD-10-CM

## 2016-11-24 DIAGNOSIS — O34219 Maternal care for unspecified type scar from previous cesarean delivery: Secondary | ICD-10-CM

## 2016-11-24 DIAGNOSIS — Z331 Pregnant state, incidental: Secondary | ICD-10-CM

## 2016-11-24 DIAGNOSIS — Z1389 Encounter for screening for other disorder: Secondary | ICD-10-CM

## 2016-11-24 DIAGNOSIS — Z3A15 15 weeks gestation of pregnancy: Secondary | ICD-10-CM

## 2016-11-24 DIAGNOSIS — Z98891 History of uterine scar from previous surgery: Secondary | ICD-10-CM | POA: Insufficient documentation

## 2016-11-24 DIAGNOSIS — Z1379 Encounter for other screening for genetic and chromosomal anomalies: Secondary | ICD-10-CM

## 2016-11-24 LAB — POCT URINALYSIS DIPSTICK
Blood, UA: NEGATIVE
GLUCOSE UA: NEGATIVE
KETONES UA: NEGATIVE
LEUKOCYTES UA: NEGATIVE
Nitrite, UA: NEGATIVE
Protein, UA: NEGATIVE

## 2016-11-24 NOTE — Progress Notes (Signed)
INITIAL OBSTETRICAL VISIT Patient name: Julie Zimmerman MRN [id number]  Date of birth: [birth year] Chief Complaint:   Initial Prenatal Visit (constipation)  History of Present Illness:   Julie Zimmerman is a 21 y.o. 62P1001 Caucasian female at 8961w2d by 12wk u/s, with an Estimated Date of Delivery: 05/16/17 being seen today for her initial obstetrical visit.   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 Arbutus PedMohamed [facility], was thought to be reactive d/t recent illness, repeat CBC was normal.   Today she reports constipation- hasn't tried anything. Also occ panic attacks at work, had prior to pregnancy, but have gotten a little worse w/ pregnancy. Declines need for meds/counseling. Denies h/o depression/anxiety.  No LMP recorded (lmp unknown). Patient is pregnant. Last pap 05/03/15. Results were: normal Review of Systems:   Pertinent items are noted in HPI Denies cramping/contractions, leakage of fluid, vaginal bleeding, abnormal vaginal discharge w/ itching/odor/irritation, headaches, visual changes, shortness of breath, chest pain, abdominal pain, severe nausea/vomiting, or problems with urination or bowel movements unless otherwise stated above.  Pertinent History Reviewed:  Reviewed past medical,surgical, social, obstetrical and family history.  Reviewed problem list, medications and allergies. OB History  Gravida Para Term Preterm AB Living  2 1 1     1   SAB TAB Ectopic Multiple Live Births          1    # Outcome Date GA Lbr Len/2nd Weight Sex Delivery Anes PTL Lv  2 Current           1 Term 11/05/10 6126w2d  7 lb 11 oz (3.487 kg) M CS-LVertical Spinal N LIV     Physical Assessment:   Vitals:   11/24/16 0929  BP: 110/78  Pulse: 65  Weight: 136 lb 8 oz (61.9 kg)  Body mass index is 22.71 kg/m.       Physical Examination:  General appearance - well appearing, and in no  distress  Mental status - alert, oriented to person, place, and time  Psych:  She has a normal mood and affect  Skin - warm and dry, normal color, no suspicious lesions noted  Chest - effort normal, all lung fields clear to auscultation bilaterally  Heart - normal rate and regular rhythm  Abdomen - soft, nontender  Extremities:  No swelling or varicosities noted  Thin prep pap is not done  HR HPV cotesting  Fetal Heart Rate (bpm): 135 via doppler  Results for orders placed or performed in visit on 11/24/16 (from the past 24 hour(s))  POCT urinalysis dipstick   Collection Time: 11/24/16  9:54 AM  Result Value Ref Range   Color, UA     Clarity, UA     Glucose, UA neg    Bilirubin, UA     Ketones, UA neg    Spec Grav, UA  1.010 - 1.025   Blood, UA neg    pH, UA  5.0 - 8.0   Protein, UA neg    Urobilinogen, UA  0.2 or 1.0 E.U./dL   Nitrite, UA neg    Leukocytes, UA Negative Negative    Assessment & Plan:  1) Low-Risk Pregnancy G2P1001 at 461w2d with an Estimated Date of Delivery: 05/16/17   2) Initial OB visit  3) Prev c/s for breech> discussed VBAC, gave consent to take home and review, thinking she wants repeat c/s  4) Previous h/o polycythemia> was thought to  be d/t illness, will check w/ CBC today  5) Panic attacks> discussed stress/anxiety relief measures, let us know if feels she needs counseling, etc  Initial labs obtained Continue prenatal vitamins Reviewed n/v relief measures and warning s/s to report Reviewed recommended weight gain based on pre-gravid BMI Encouraged well-balanced diet Genetic Screening discussed Integrated Screen: has already done 1st IT/NT, doing 2nd IT today Cystic fibrosis screening discussed declined Ultrasound discussed; fetal survey: requested CCNC completed>not sent, not applying for preg mcaid  Follow-up: Return for As scheduled 11/9 for anatomy u/s and LROB.   Orders Placed This Encounter  Procedures  . GC/Chlamydia Probe Amp  .  Urine Culture  . US OB Comp + 14 Wk  . Pain Management Screening Profile (10S)  . CBC  . Hepatitis B surface antigen  . HIV antibody  . Varicella zoster antibody, IgG  . Urinalysis, Routine w reflex microscopic  . Rubella screen  . RPR  . INTEGRATED 2  . POCT urinalysis dipstick  . ABO/Rh  . Antibody screen    Marge Duncans CNM, [facility] 11/24/2016 12:23 PM

## 2016-11-24 NOTE — Patient Instructions (Signed)
Constipation  Drink plenty of fluid, preferably water, throughout the day  Eat foods high in fiber such as fruits, vegetables, and grains  Exercise, such as walking, is a good way to keep your bowels regular  Drink warm fluids, especially warm prune juice, or decaf coffee  Eat a 1/2 cup of real oatmeal (not instant), 1/2 cup applesauce, and 1/2-1 cup warm prune juice every day  If needed, you may take Colace (docusate sodium) stool softener once or twice a day to help keep the stool soft. If you are pregnant, wait until you are out of your first trimester (12-14 weeks of pregnancy)  If you still are having problems with constipation, you may take Miralax once daily as needed to help keep your bowels regular.  If you are pregnant, wait until you are out of your first trimester (12-14 weeks of pregnancy)    Second Trimester of Pregnancy The second trimester is from week 14 through week 27 (months 4 through 6). The second trimester is often a time when you feel your best. Your body has adjusted to being pregnant, and you begin to feel better physically. Usually, morning sickness has lessened or quit completely, you may have more energy, and you may have an increase in appetite. The second trimester is also a time when the fetus is growing rapidly. At the end of the sixth month, the fetus is about 9 inches long and weighs about 1 pounds. You will likely begin to feel the baby move (quickening) between 16 and 20 weeks of pregnancy. Body changes during your second trimester Your body continues to go through many changes during your second trimester. The changes vary from woman to woman.  Your weight will continue to increase. You will notice your lower abdomen bulging out.  You may begin to get stretch marks on your hips, abdomen, and breasts.  You may develop headaches that can be relieved by medicines. The medicines should be approved by your health care provider.  You may urinate more often  because the fetus is pressing on your bladder.  You may develop or continue to have heartburn as a result of your pregnancy.  You may develop constipation because certain hormones are causing the muscles that push waste through your intestines to slow down.  You may develop hemorrhoids or swollen, bulging veins (varicose veins).  You may have back pain. This is caused by: ? Weight gain. ? Pregnancy hormones that are relaxing the joints in your pelvis. ? A shift in weight and the muscles that support your balance.  Your breasts will continue to grow and they will continue to become tender.  Your gums may bleed and may be sensitive to brushing and flossing.  Dark spots or blotches (chloasma, mask of pregnancy) may develop on your face. This will likely fade after the baby is born.  A dark line from your belly button to the pubic area (linea nigra) may appear. This will likely fade after the baby is born.  You may have changes in your hair. These can include thickening of your hair, rapid growth, and changes in texture. Some women also have hair loss during or after pregnancy, or hair that feels dry or thin. Your hair will most likely return to normal after your baby is born.  What to expect at prenatal visits During a routine prenatal visit:  You will be weighed to make sure you and the fetus are growing normally.  Your blood pressure will be taken.  Your abdomen will be measured to track your baby's growth.  The fetal heartbeat will be listened to.  Any test results from the previous visit will be discussed.  Your health care provider may ask you:  How you are feeling.  If you are feeling the baby move.  If you have had any abnormal symptoms, such as leaking fluid, bleeding, severe headaches, or abdominal cramping.  If you are using any tobacco products, including cigarettes, chewing tobacco, and electronic cigarettes.  If you have any questions.  Other tests that may  be performed during your second trimester include:  Blood tests that check for: ? Low iron levels (anemia). ? High blood sugar that affects pregnant women (gestational diabetes) between 324 and 28 weeks. ? Rh antibodies. This is to check for a protein on red blood cells (Rh factor).  Urine tests to check for infections, diabetes, or protein in the urine.  An ultrasound to confirm the proper growth and development of the baby.  An amniocentesis to check for possible genetic problems.  Fetal screens for spina bifida and Down syndrome.  HIV (human immunodeficiency virus) testing. Routine prenatal testing includes screening for HIV, unless you choose not to have this test.  Follow these instructions at home: Medicines  Follow your health care provider's instructions regarding medicine use. Specific medicines may be either safe or unsafe to take during pregnancy.  Take a prenatal vitamin that contains at least 600 micrograms (mcg) of folic acid.  If you develop constipation, try taking a stool softener if your health care provider approves. Eating and drinking  Eat a balanced diet that includes fresh fruits and vegetables, whole grains, good sources of protein such as meat, eggs, or tofu, and low-fat dairy. Your health care provider will help you determine the amount of weight gain that is right for you.  Avoid raw meat and uncooked cheese. These carry germs that can cause birth defects in the baby.  If you have low calcium intake from food, talk to your health care provider about whether you should take a daily calcium supplement.  Limit foods that are high in fat and processed sugars, such as fried and sweet foods.  To prevent constipation: ? Drink enough fluid to keep your urine clear or pale yellow. ? Eat foods that are high in fiber, such as fresh fruits and vegetables, whole grains, and beans. Activity  Exercise only as directed by your health care provider. Most women can  continue their usual exercise routine during pregnancy. Try to exercise for 30 minutes at least 5 days a week. Stop exercising if you experience uterine contractions.  Avoid heavy lifting, wear low heel shoes, and practice good posture.  A sexual relationship may be continued unless your health care provider directs you otherwise. Relieving pain and discomfort  Wear a good support bra to prevent discomfort from breast tenderness.  Take warm sitz baths to soothe any pain or discomfort caused by hemorrhoids. Use hemorrhoid cream if your health care provider approves.  Rest with your legs elevated if you have leg cramps or low back pain.  If you develop varicose veins, wear support hose. Elevate your feet for 15 minutes, 3-4 times a day. Limit salt in your diet. Prenatal Care  Write down your questions. Take them to your prenatal visits.  Keep all your prenatal visits as told by your health care provider. This is important. Safety  Wear your seat belt at all times when driving.  Make a list of  emergency phone numbers, including numbers for family, friends, the hospital, and police and fire departments. General instructions  Ask your health care provider for a referral to a local prenatal education class. Begin classes no later than the beginning of month 6 of your pregnancy.  Ask for help if you have counseling or nutritional needs during pregnancy. Your health care provider can offer advice or refer you to specialists for help with various needs.  Do not use hot tubs, steam rooms, or saunas.  Do not douche or use tampons or scented sanitary pads.  Do not cross your legs for long periods of time.  Avoid cat litter boxes and soil used by cats. These carry germs that can cause birth defects in the baby and possibly loss of the fetus by miscarriage or stillbirth.  Avoid all smoking, herbs, alcohol, and unprescribed drugs. Chemicals in these products can affect the formation and growth  of the baby.  Do not use any products that contain nicotine or tobacco, such as cigarettes and e-cigarettes. If you need help quitting, ask your health care provider.  Visit your dentist if you have not gone yet during your pregnancy. Use a soft toothbrush to brush your teeth and be gentle when you floss. Contact a health care provider if:  You have dizziness.  You have mild pelvic cramps, pelvic pressure, or nagging pain in the abdominal area.  You have persistent nausea, vomiting, or diarrhea.  You have a bad smelling vaginal discharge.  You have pain when you urinate. Get help right away if:  You have a fever.  You are leaking fluid from your vagina.  You have spotting or bleeding from your vagina.  You have severe abdominal cramping or pain.  You have rapid weight gain or weight loss.  You have shortness of breath with chest pain.  You notice sudden or extreme swelling of your face, hands, ankles, feet, or legs.  You have not felt your baby move in over an hour.  You have severe headaches that do not go away when you take medicine.  You have vision changes. Summary  The second trimester is from week 14 through week 27 (months 4 through 6). It is also a time when the fetus is growing rapidly.  Your body goes through many changes during pregnancy. The changes vary from woman to woman.  Avoid all smoking, herbs, alcohol, and unprescribed drugs. These chemicals affect the formation and growth your baby.  Do not use any tobacco products, such as cigarettes, chewing tobacco, and e-cigarettes. If you need help quitting, ask your health care provider.  Contact your health care provider if you have any questions. Keep all prenatal visits as told by your health care provider. This is important. This information is not intended to replace advice given to you by your health care provider. Make sure you discuss any questions you have with your health care provider. Document  Released: 01/17/2001 Document Revised: 07/01/2015 Document Reviewed: 03/26/2012 Elsevier Interactive Patient Education  2017 [employer].

## 2016-11-25 LAB — CBC
HEMATOCRIT: 39.7 % (ref 34.0–46.6)
HEMOGLOBIN: 12.7 g/dL (ref 11.1–15.9)
MCH: 29.5 pg (ref 26.6–33.0)
MCHC: 32 g/dL (ref 31.5–35.7)
MCV: 92 fL (ref 79–97)
Platelets: 273 10*3/uL (ref 150–379)
RBC: 4.3 x10E6/uL (ref 3.77–5.28)
RDW: 14.3 % (ref 12.3–15.4)
WBC: 9.2 10*3/uL (ref 3.4–10.8)

## 2016-11-25 LAB — URINALYSIS, ROUTINE W REFLEX MICROSCOPIC
Bilirubin, UA: NEGATIVE
GLUCOSE, UA: NEGATIVE
Ketones, UA: NEGATIVE
Leukocytes, UA: NEGATIVE
Nitrite, UA: NEGATIVE
PH UA: 8 — AB (ref 5.0–7.5)
PROTEIN UA: NEGATIVE
RBC, UA: NEGATIVE
Specific Gravity, UA: 1.019 (ref 1.005–1.030)
UUROB: 0.2 mg/dL (ref 0.2–1.0)

## 2016-11-25 LAB — ANTIBODY SCREEN: Antibody Screen: NEGATIVE

## 2016-11-25 LAB — MED LIST OPTION NOT SELECTED

## 2016-11-25 LAB — ABO/RH: Rh Factor: POSITIVE

## 2016-11-25 LAB — HIV ANTIBODY (ROUTINE TESTING W REFLEX): HIV SCREEN 4TH GENERATION: NONREACTIVE

## 2016-11-25 LAB — RUBELLA SCREEN: Rubella Antibodies, IGG: <0.9 index — ABNORMAL LOW (ref >0.99)

## 2016-11-25 LAB — VARICELLA ZOSTER ANTIBODY, IGG: Varicella zoster IgG: 955 index (ref >165)

## 2016-11-25 LAB — RPR: RPR Ser Ql: NONREACTIVE

## 2016-11-25 LAB — HEPATITIS B SURFACE ANTIGEN: HEP B S AG: NEGATIVE

## 2016-11-26 LAB — URINE CULTURE

## 2016-11-27 LAB — PMP SCREEN PROFILE (10S), URINE
AMPHETAMINE SCREEN URINE: NEGATIVE ng/mL
BARBITURATE SCREEN URINE: NEGATIVE ng/mL
BENZODIAZEPINE SCREEN, URINE: NEGATIVE ng/mL
CANNABINOIDS UR QL SCN: NEGATIVE ng/mL
COCAINE(METAB.)SCREEN, URINE: NEGATIVE ng/mL
Creatinine(Crt), U: 113.7 mg/dL (ref 20.0–300.0)
Methadone Screen, Urine: NEGATIVE ng/mL
OXYCODONE+OXYMORPHONE UR QL SCN: NEGATIVE ng/mL
Opiate Scrn, Ur: NEGATIVE ng/mL
PHENCYCLIDINE QUANTITATIVE URINE: NEGATIVE ng/mL
Ph of Urine: 7.9 (ref 4.5–8.9)
Propoxyphene Scrn, Ur: NEGATIVE ng/mL

## 2016-11-29 LAB — GC/CHLAMYDIA PROBE AMP
Chlamydia trachomatis, NAA: NEGATIVE
Neisseria gonorrhoeae by PCR: NEGATIVE

## 2016-11-30 ENCOUNTER — Encounter: Payer: Self-pay | Admitting: Women's Health

## 2016-11-30 DIAGNOSIS — Z2839 Other underimmunization status: Secondary | ICD-10-CM | POA: Insufficient documentation

## 2016-11-30 DIAGNOSIS — O9989 Other specified diseases and conditions complicating pregnancy, childbirth and the puerperium: Secondary | ICD-10-CM

## 2016-11-30 DIAGNOSIS — Z283 Underimmunization status: Secondary | ICD-10-CM | POA: Insufficient documentation

## 2016-12-01 LAB — INTEGRATED 2
AFP MARKER: 39.4 ng/mL
AFP MoM: 1.3
Crown Rump Length: 58.8 mm
DIA MOM: 1.18
DIA Value: 219.7 pg/mL
ESTRIOL UNCONJUGATED: 0.76 ng/mL
GEST. AGE ON COLLECTION DATE: 12.4 wk
GESTATIONAL AGE: 15.4 wk
HCG MOM: 1.95
HCG VALUE: 81.5 [IU]/mL
MATERNAL AGE AT EDD: 24 a
NUCHAL TRANSLUCENCY MOM: 0.79
Nuchal Translucency (NT): 1.2 mm
Number of Fetuses: 1
PAPP-A MoM: 0.92
PAPP-A Value: 941.5 ng/mL
Test Results:: NEGATIVE
WEIGHT: 135 [lb_av]
Weight: 137 [lb_av]
uE3 MoM: 1.11

## 2016-12-15 ENCOUNTER — Ambulatory Visit (INDEPENDENT_AMBULATORY_CARE_PROVIDER_SITE_OTHER): Payer: 59 | Admitting: Women's Health

## 2016-12-15 ENCOUNTER — Encounter: Payer: Self-pay | Admitting: Women's Health

## 2016-12-15 ENCOUNTER — Ambulatory Visit (INDEPENDENT_AMBULATORY_CARE_PROVIDER_SITE_OTHER): Payer: 59

## 2016-12-15 VITALS — BP 118/82 | HR 82 | Wt 139.0 lb

## 2016-12-15 DIAGNOSIS — Z3482 Encounter for supervision of other normal pregnancy, second trimester: Secondary | ICD-10-CM

## 2016-12-15 DIAGNOSIS — Z3403 Encounter for supervision of normal first pregnancy, third trimester: Secondary | ICD-10-CM

## 2016-12-15 DIAGNOSIS — Z331 Pregnant state, incidental: Secondary | ICD-10-CM

## 2016-12-15 DIAGNOSIS — Z3A18 18 weeks gestation of pregnancy: Secondary | ICD-10-CM

## 2016-12-15 DIAGNOSIS — Z98891 History of uterine scar from previous surgery: Secondary | ICD-10-CM

## 2016-12-15 DIAGNOSIS — Z363 Encounter for antenatal screening for malformations: Secondary | ICD-10-CM

## 2016-12-15 DIAGNOSIS — O283 Abnormal ultrasonic finding on antenatal screening of mother: Secondary | ICD-10-CM | POA: Insufficient documentation

## 2016-12-15 DIAGNOSIS — Z1389 Encounter for screening for other disorder: Secondary | ICD-10-CM

## 2016-12-15 LAB — POCT URINALYSIS DIPSTICK
Blood, UA: NEGATIVE
GLUCOSE UA: NEGATIVE
KETONES UA: NEGATIVE
Nitrite, UA: NEGATIVE
Protein, UA: NEGATIVE

## 2016-12-15 NOTE — Progress Notes (Signed)
US 18+2 wks,cephalic,post pl gr 0,normal ovaries bilat,cx 4.5 cm,svp of fluid 4.1 cm,RVEICF 1.9 mm,EFW 231 g,anatomy complete

## 2016-12-15 NOTE — Progress Notes (Signed)
   LOW-RISK PREGNANCY VISIT Patient name: Julie Zimmerman MRN [id number]  Date of birth: 1995/04/09 Chief Complaint:   Routine Prenatal Visit (u/s today; pt states she has heart murmur and is concerned)  History of Present Illness:   Julie Zimmerman is a 21 y.o. 252P1001 female at 2621w2d with an Estimated Date of Delivery: 05/16/17 being seen today for ongoing management of a low-risk pregnancy.  Today she reports no complaints.  . Vag. Bleeding: None.  Movement: Present. denies leaking of fluid. Review of Systems:   Pertinent items are noted in HPI Denies abnormal vaginal discharge w/ itching/odor/irritation, headaches, visual changes, shortness of breath, chest pain, abdominal pain, severe nausea/vomiting, or problems with urination or bowel movements unless otherwise stated above. Pertinent History Reviewed:  Reviewed past medical,surgical, social, obstetrical and family history.  Reviewed problem list, medications and allergies. Physical Assessment:   Vitals:   12/15/16 1256  BP: 118/82  Pulse: 82  Weight: 139 lb (63 kg)  Body mass index is 23.13 kg/m.        Physical Examination:   General appearance: Well appearing, and in no distress  Mental status: Alert, oriented to person, place, and time  Skin: Warm & dry  Cardiovascular: Normal heart rate noted  Respiratory: Normal respiratory effort, no distress  Abdomen: Soft, gravid, nontender  Pelvic: Cervical exam deferred         Extremities: Edema: None  Fetal Status: Fetal Heart Rate (bpm): + u/s   Movement: Present    Today's Anatomy u/s: US 18+2 wks,cephalic,post pl gr 0,normal ovaries bilat,cx 4.5 cm,svp of fluid 4.1 cm,RVEICF 1.9 mm,EFW 231 g,anatomy complete   Results for orders placed or performed in visit on 12/15/16 (from the past 24 hour(s))  POCT urinalysis dipstick   Collection Time: 12/15/16 12:58 PM  Result Value Ref Range   Color, UA     Clarity, UA     Glucose, UA neg    Bilirubin, UA     Ketones, UA neg     Spec Grav, UA  1.010 - 1.025   Blood, UA neg    pH, UA  5.0 - 8.0   Protein, UA neg    Urobilinogen, UA  0.2 or 1.0 E.U./dL   Nitrite, UA neg    Leukocytes, UA Small (1+) (A) Negative    Assessment & Plan:  1) Low-risk pregnancy G2P1001 at 221w2d with an Estimated Date of Delivery: 05/16/17   2) Isolated Rt EICF, w/ neg nt/it, discussed and gave printed info, no further f/u needed   Labs/procedures today: anatomy u/s, declined flu shot  Plan:  Continue routine obstetrical care   Reviewed: Preterm labor symptoms and general obstetric precautions including but not limited to vaginal bleeding, contractions, leaking of fluid and fetal movement were reviewed in detail with the patient.  All questions were answered  Follow-up: Return in about 4 weeks (around 01/12/2017) for LROB.  Orders Placed This Encounter  Procedures  . POCT urinalysis dipstick   Marge DuncansBooker, Evona Westra Randall CNM, [facility]NP-BC 12/15/2016 2:05 PM

## 2016-12-15 NOTE — Patient Instructions (Signed)
Julie Zimmerman, I greatly value your feedback.  If you receive a survey following your visit with us today, we appreciate you taking the time to fill it out.  Thanks, Julie HaffKim Jentzen Zimmerman, CNM, WHNP-BC   Second Trimester of Pregnancy The second trimester is from week 14 through week 27 (months 4 through 6). The second trimester is often a time when you feel your best. Your body has adjusted to being pregnant, and you begin to feel better physically. Usually, morning sickness has lessened or quit completely, you may have more energy, and you may have an increase in appetite. The second trimester is also a time when the fetus is growing rapidly. At the end of the sixth month, the fetus is about 9 inches long and weighs about 1 pounds. You will likely begin to feel the baby move (quickening) between 16 and 20 weeks of pregnancy. Body changes during your second trimester Your body continues to go through many changes during your second trimester. The changes vary from woman to woman.  Your weight will continue to increase. You will notice your lower abdomen bulging out.  You may begin to get stretch marks on your hips, abdomen, and breasts.  You may develop headaches that can be relieved by medicines. The medicines should be approved by your health care provider.  You may urinate more often because the fetus is pressing on your bladder.  You may develop or continue to have heartburn as a result of your pregnancy.  You may develop constipation because certain hormones are causing the muscles that push waste through your intestines to slow down.  You may develop hemorrhoids or swollen, bulging veins (varicose veins).  You may have back pain. This is caused by: ? Weight gain. ? Pregnancy hormones that are relaxing the joints in your pelvis. ? A shift in weight and the muscles that support your balance.  Your breasts will continue to grow and they will continue to become tender.  Your gums may bleed  and may be sensitive to brushing and flossing.  Dark spots or blotches (chloasma, mask of pregnancy) may develop on your face. This will likely fade after the baby is born.  A dark line from your belly button to the pubic area (linea nigra) may appear. This will likely fade after the baby is born.  You may have changes in your hair. These can include thickening of your hair, rapid growth, and changes in texture. Some women also have hair loss during or after pregnancy, or hair that feels dry or thin. Your hair will most likely return to normal after your baby is born.  What to expect at prenatal visits During a routine prenatal visit:  You will be weighed to make sure you and the fetus are growing normally.  Your blood pressure will be taken.  Your abdomen will be measured to track your baby's growth.  The fetal heartbeat will be listened to.  Any test results from the previous visit will be discussed.  Your health care provider may ask you:  How you are feeling.  If you are feeling the baby move.  If you have had any abnormal symptoms, such as leaking fluid, bleeding, severe headaches, or abdominal cramping.  If you are using any tobacco products, including cigarettes, chewing tobacco, and electronic cigarettes.  If you have any questions.  Other tests that may be performed during your second trimester include:  Blood tests that check for: ? Low iron levels (anemia). ? High blood  sugar that affects pregnant women (gestational diabetes) between 55 and 28 weeks. ? Rh antibodies. This is to check for a protein on red blood cells (Rh factor).  Urine tests to check for infections, diabetes, or protein in the urine.  An ultrasound to confirm the proper growth and development of the baby.  An amniocentesis to check for possible genetic problems.  Fetal screens for spina bifida and Down syndrome.  HIV (human immunodeficiency virus) testing. Routine prenatal testing includes  screening for HIV, unless you choose not to have this test.  Follow these instructions at home: Medicines  Follow your health care provider's instructions regarding medicine use. Specific medicines may be either safe or unsafe to take during pregnancy.  Take a prenatal vitamin that contains at least 600 micrograms (mcg) of folic acid.  If you develop constipation, try taking a stool softener if your health care provider approves. Eating and drinking  Eat a balanced diet that includes fresh fruits and vegetables, whole grains, good sources of protein such as meat, eggs, or tofu, and low-fat dairy. Your health care provider will help you determine the amount of weight gain that is right for you.  Avoid raw meat and uncooked cheese. These carry germs that can cause birth defects in the baby.  If you have low calcium intake from food, talk to your health care provider about whether you should take a daily calcium supplement.  Limit foods that are high in fat and processed sugars, such as fried and sweet foods.  To prevent constipation: ? Drink enough fluid to keep your urine clear or pale yellow. ? Eat foods that are high in fiber, such as fresh fruits and vegetables, whole grains, and beans. Activity  Exercise only as directed by your health care provider. Most women can continue their usual exercise routine during pregnancy. Try to exercise for 30 minutes at least 5 days a week. Stop exercising if you experience uterine contractions.  Avoid heavy lifting, wear low heel shoes, and practice good posture.  A sexual relationship may be continued unless your health care provider directs you otherwise. Relieving pain and discomfort  Wear a good support bra to prevent discomfort from breast tenderness.  Take warm sitz baths to soothe any pain or discomfort caused by hemorrhoids. Use hemorrhoid cream if your health care provider approves.  Rest with your legs elevated if you have leg cramps  or low back pain.  If you develop varicose veins, wear support hose. Elevate your feet for 15 minutes, 3-4 times a day. Limit salt in your diet. Prenatal Care  Write down your questions. Take them to your prenatal visits.  Keep all your prenatal visits as told by your health care provider. This is important. Safety  Wear your seat belt at all times when driving.  Make a list of emergency phone numbers, including numbers for family, friends, the hospital, and police and fire departments. General instructions  Ask your health care provider for a referral to a local prenatal education class. Begin classes no later than the beginning of month 6 of your pregnancy.  Ask for help if you have counseling or nutritional needs during pregnancy. Your health care provider can offer advice or refer you to specialists for help with various needs.  Do not use hot tubs, steam rooms, or saunas.  Do not douche or use tampons or scented sanitary pads.  Do not cross your legs for long periods of time.  Avoid cat litter boxes and soil used  by cats. These carry germs that can cause birth defects in the baby and possibly loss of the fetus by miscarriage or stillbirth.  Avoid all smoking, herbs, alcohol, and unprescribed drugs. Chemicals in these products can affect the formation and growth of the baby.  Do not use any products that contain nicotine or tobacco, such as cigarettes and e-cigarettes. If you need help quitting, ask your health care provider.  Visit your dentist if you have not gone yet during your pregnancy. Use a soft toothbrush to brush your teeth and be gentle when you floss. Contact a health care provider if:  You have dizziness.  You have mild pelvic cramps, pelvic pressure, or nagging pain in the abdominal area.  You have persistent nausea, vomiting, or diarrhea.  You have a bad smelling vaginal discharge.  You have pain when you urinate. Get help right away if:  You have a  fever.  You are leaking fluid from your vagina.  You have spotting or bleeding from your vagina.  You have severe abdominal cramping or pain.  You have rapid weight gain or weight loss.  You have shortness of breath with chest pain.  You notice sudden or extreme swelling of your face, hands, ankles, feet, or legs.  You have not felt your baby move in over an hour.  You have severe headaches that do not go away when you take medicine.  You have vision changes. Summary  The second trimester is from week 14 through week 27 (months 4 through 6). It is also a time when the fetus is growing rapidly.  Your body goes through many changes during pregnancy. The changes vary from woman to woman.  Avoid all smoking, herbs, alcohol, and unprescribed drugs. These chemicals affect the formation and growth your baby.  Do not use any tobacco products, such as cigarettes, chewing tobacco, and e-cigarettes. If you need help quitting, ask your health care provider.  Contact your health care provider if you have any questions. Keep all prenatal visits as told by your health care provider. This is important. This information is not intended to replace advice given to you by your health care provider. Make sure you discuss any questions you have with your health care provider. Document Released: 01/17/2001 Document Revised: 07/01/2015 Document Reviewed: 03/26/2012 Elsevier Interactive Patient Education  2017 [employer].

## 2016-12-25 ENCOUNTER — Other Ambulatory Visit: Payer: Self-pay | Admitting: Women's Health

## 2016-12-25 ENCOUNTER — Telehealth: Payer: Self-pay | Admitting: *Deleted

## 2016-12-25 DIAGNOSIS — O2342 Unspecified infection of urinary tract in pregnancy, second trimester: Secondary | ICD-10-CM

## 2016-12-25 MED ORDER — NITROFURANTOIN MONOHYD MACRO 100 MG PO CAPS: 100.0000 mg | ORAL_CAPSULE | Freq: Two times a day (BID) | ORAL | 0 refills | Status: DC

## 2016-12-25 NOTE — Telephone Encounter (Signed)
Informed pt that an antibiotic was sent to her pharmacy but we would like for her to leave us a urine sample before she starts the antibiotic. Pt states that it will probably be tomorrow before she can come by. Informed pt that would be fine.

## 2016-12-25 NOTE — Telephone Encounter (Signed)
Pt called stating that she is having burning with urination and urinary frequency. She states that she gets UTIs often after having her last child so she knows what they feel like. She is requesting medication to treat this. Informed pt that I would send her request to a provider but the provider may request that we get a urine sample first. Pt verbalized understanding.

## 2016-12-25 NOTE — Progress Notes (Signed)
Pt called w/ reports of burning w/ urination and urinary frequency. H/O frequent UTIs after having previous child, feels like UTI to her, requests meds. Rx macrobid bid x 7d, to come leave urine specimen for culture prior to starting antibiotic.  Cheral MarkerKimberly R. Dontreal Miera, CNM, WHNP-BC 12/25/2016 1:20 PM

## 2016-12-26 ENCOUNTER — Other Ambulatory Visit: Payer: Self-pay

## 2016-12-28 LAB — URINE CULTURE: ORGANISM ID, BACTERIA: NO GROWTH

## 2017-01-05 ENCOUNTER — Other Ambulatory Visit: Payer: Self-pay

## 2017-01-05 ENCOUNTER — Encounter: Payer: Self-pay | Admitting: Obstetrics & Gynecology

## 2017-01-05 ENCOUNTER — Telehealth: Payer: Self-pay | Admitting: *Deleted

## 2017-01-05 ENCOUNTER — Ambulatory Visit (INDEPENDENT_AMBULATORY_CARE_PROVIDER_SITE_OTHER): Payer: 59 | Admitting: Obstetrics & Gynecology

## 2017-01-05 VITALS — BP 122/80 | HR 85 | Wt 146.0 lb

## 2017-01-05 DIAGNOSIS — Z331 Pregnant state, incidental: Secondary | ICD-10-CM

## 2017-01-05 DIAGNOSIS — J157 Pneumonia due to Mycoplasma pneumoniae: Secondary | ICD-10-CM | POA: Diagnosis not present

## 2017-01-05 DIAGNOSIS — Z1389 Encounter for screening for other disorder: Secondary | ICD-10-CM

## 2017-01-05 LAB — POCT URINALYSIS DIPSTICK
Blood, UA: NEGATIVE
GLUCOSE UA: NEGATIVE
LEUKOCYTES UA: NEGATIVE
NITRITE UA: NEGATIVE
Protein, UA: NEGATIVE

## 2017-01-05 MED ORDER — AMOXICILLIN-POT CLAVULANATE 875-125 MG PO TABS: 1.0000 | ORAL_TABLET | Freq: Two times a day (BID) | ORAL | 0 refills | Status: DC

## 2017-01-05 MED ORDER — AZITHROMYCIN 500 MG PO TABS: ORAL_TABLET | ORAL | 0 refills | Status: DC

## 2017-01-05 NOTE — Telephone Encounter (Signed)
Patient states she started having a cough about 5 weeks ago that got better but is now back.  Her son was sick as well so she thought it that but is now starting to feel bad. The cough is pretty bad but also is having sharp shooting pains when she sneezes, coughs, etc. She is now SOB with extreme exertion and especially when laying down. Pt states she also has not been feeling the baby move as much. Informed patient she needed to be evaluated so could come in to see us today or go to [facility]. Pt stated she would come in to see us.
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
## 2017-01-09 ENCOUNTER — Other Ambulatory Visit: Payer: Self-pay

## 2017-01-09 ENCOUNTER — Ambulatory Visit (INDEPENDENT_AMBULATORY_CARE_PROVIDER_SITE_OTHER): Payer: 59 | Admitting: Obstetrics & Gynecology

## 2017-01-09 ENCOUNTER — Encounter: Payer: Self-pay | Admitting: Obstetrics & Gynecology

## 2017-01-09 VITALS — BP 124/78 | HR 82 | Wt 144.0 lb

## 2017-01-09 DIAGNOSIS — Z3A21 21 weeks gestation of pregnancy: Secondary | ICD-10-CM

## 2017-01-09 DIAGNOSIS — Z331 Pregnant state, incidental: Secondary | ICD-10-CM

## 2017-01-09 DIAGNOSIS — Z1389 Encounter for screening for other disorder: Secondary | ICD-10-CM

## 2017-01-09 DIAGNOSIS — O99512 Diseases of the respiratory system complicating pregnancy, second trimester: Secondary | ICD-10-CM

## 2017-01-09 DIAGNOSIS — J189 Pneumonia, unspecified organism: Secondary | ICD-10-CM

## 2017-01-09 LAB — POCT URINALYSIS DIPSTICK
Glucose, UA: NEGATIVE
Ketones, UA: NEGATIVE
LEUKOCYTES UA: NEGATIVE
NITRITE UA: NEGATIVE
Protein, UA: NEGATIVE
RBC UA: NEGATIVE

## 2017-01-09 MED ORDER — HYDROCOD POLST-CPM POLST ER 10-8 MG/5ML PO SUER: 5.0000 mL | Freq: Two times a day (BID) | ORAL | 0 refills | Status: DC | PRN
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
## 2017-01-12 ENCOUNTER — Telehealth: Payer: Self-pay | Admitting: *Deleted

## 2017-01-12 ENCOUNTER — Encounter: Payer: Self-pay | Admitting: Obstetrics & Gynecology

## 2017-01-12 NOTE — Telephone Encounter (Signed)
Patient called stating she coughed really hard and felt and heard a loud "pop" on her right side. States it hurts constantly but more when she moves. She was starting to feel better so she didn't take her cough medicine but is now starting to cough more. Spoke to Dr Despina HiddenEure who stated patient probably has some cartridge between her ribs that has moved and will eventually get back in place. At this time there is nothing that can be done but to take her cough medicine to help suppress her coughing spells. Verbalized understanding.

## 2017-01-16 ENCOUNTER — Ambulatory Visit (INDEPENDENT_AMBULATORY_CARE_PROVIDER_SITE_OTHER): Payer: 59 | Admitting: Obstetrics & Gynecology

## 2017-01-16 ENCOUNTER — Encounter: Payer: Self-pay | Admitting: Obstetrics & Gynecology

## 2017-01-16 VITALS — BP 120/80 | HR 74 | Wt 146.0 lb

## 2017-01-16 DIAGNOSIS — Z331 Pregnant state, incidental: Secondary | ICD-10-CM

## 2017-01-16 DIAGNOSIS — O99512 Diseases of the respiratory system complicating pregnancy, second trimester: Secondary | ICD-10-CM

## 2017-01-16 DIAGNOSIS — Z1389 Encounter for screening for other disorder: Secondary | ICD-10-CM

## 2017-01-16 DIAGNOSIS — J189 Pneumonia, unspecified organism: Secondary | ICD-10-CM

## 2017-01-16 DIAGNOSIS — Z3A22 22 weeks gestation of pregnancy: Secondary | ICD-10-CM

## 2017-01-16 DIAGNOSIS — Z3402 Encounter for supervision of normal first pregnancy, second trimester: Secondary | ICD-10-CM

## 2017-01-16 LAB — POCT URINALYSIS DIPSTICK
Blood, UA: NEGATIVE
Glucose, UA: NEGATIVE
KETONES UA: NEGATIVE
Leukocytes, UA: NEGATIVE
NITRITE UA: NEGATIVE
PROTEIN UA: NEGATIVE
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
## 2017-01-23 ENCOUNTER — Telehealth: Payer: Self-pay | Admitting: Obstetrics & Gynecology

## 2017-01-23 NOTE — Telephone Encounter (Signed)
Patient states when the baby moves, she "feels a weird sensation in her vagina". Informed patient that is normal and it could be positioning of baby and it did not sound concerning. Also states she lost what she thought was part of her mucous plug about 4 days ago and had cramping for 24 hours. She is not currently cramping or bleeding and baby is active.  Advised to continue to monitor and to push fluids but to let us know if she developed pain or leaking. verbalized understanding.

## 2017-02-09 ENCOUNTER — Other Ambulatory Visit: Payer: 59

## 2017-02-09 ENCOUNTER — Ambulatory Visit (INDEPENDENT_AMBULATORY_CARE_PROVIDER_SITE_OTHER): Payer: 59 | Admitting: Women's Health

## 2017-02-09 ENCOUNTER — Other Ambulatory Visit: Payer: Self-pay

## 2017-02-09 ENCOUNTER — Encounter: Payer: Self-pay | Admitting: Women's Health

## 2017-02-09 VITALS — BP 112/68 | HR 70 | Wt 150.0 lb

## 2017-02-09 DIAGNOSIS — Z3482 Encounter for supervision of other normal pregnancy, second trimester: Secondary | ICD-10-CM

## 2017-02-09 DIAGNOSIS — Z1389 Encounter for screening for other disorder: Secondary | ICD-10-CM

## 2017-02-09 DIAGNOSIS — Z131 Encounter for screening for diabetes mellitus: Secondary | ICD-10-CM

## 2017-02-09 DIAGNOSIS — Z3A26 26 weeks gestation of pregnancy: Secondary | ICD-10-CM

## 2017-02-09 DIAGNOSIS — Z331 Pregnant state, incidental: Secondary | ICD-10-CM

## 2017-02-09 LAB — POCT URINALYSIS DIPSTICK
Blood, UA: NEGATIVE
Glucose, UA: NEGATIVE
Ketones, UA: NEGATIVE
Leukocytes, UA: NEGATIVE
NITRITE UA: NEGATIVE
PROTEIN UA: NEGATIVE

## 2017-02-09 NOTE — Patient Instructions (Signed)
Julie Zimmerman, I greatly value your feedback.  If you receive a survey following your visit with us today, we appreciate you taking the time to fill it out.  Thanks, Julie HaffKim Brya Simerly, CNM, WHNP-BC   Call the office [phone]) or go to [facility] if:  You begin to have strong, frequent contractions  Your water breaks.  Sometimes it is a big gush of fluid, sometimes it is just a trickle that keeps getting your panties wet or running down your legs  You have vaginal bleeding.  It is normal to have a small amount of spotting if your cervix was checked.   You don't feel your baby moving like normal.  If you don't, get you something to eat and drink and lay down and focus on feeling your baby move.  You should feel at least 10 movements in 2 hours.  If you don't, you should call the office or go to [facility].    Tdap Vaccine  It is recommended that you get the Tdap vaccine during the third trimester of EACH pregnancy to help protect your baby from getting pertussis (whooping cough)  27-36 weeks is the BEST time to do this so that you can pass the protection on to your baby. During pregnancy is better than after pregnancy, but if you are unable to get it during pregnancy it will be offered at the hospital.   You can get this vaccine at the health department or your family doctor  Everyone who will be around your baby should also be up-to-date on their vaccines. Adults (who are not pregnant) only need 1 dose of Tdap during adulthood.   Third Trimester of Pregnancy The third trimester is from week 29 through week 42, months 7 through 9. The third trimester is a time when the fetus is growing rapidly. At the end of the ninth month, the fetus is about 20 inches in length and weighs 6-10 pounds.  BODY CHANGES Your body goes through many changes during pregnancy. The changes vary from woman to woman.   Your weight will continue to increase. You can expect to gain 25-35 pounds (11-16 kg) by  the end of the pregnancy.  You may begin to get stretch marks on your hips, abdomen, and breasts.  You may urinate more often because the fetus is moving lower into your pelvis and pressing on your bladder.  You may develop or continue to have heartburn as a result of your pregnancy.  You may develop constipation because certain hormones are causing the muscles that push waste through your intestines to slow down.  You may develop hemorrhoids or swollen, bulging veins (varicose veins).  You may have pelvic pain because of the weight gain and pregnancy hormones relaxing your joints between the bones in your pelvis. Backaches may result from overexertion of the muscles supporting your posture.  You may have changes in your hair. These can include thickening of your hair, rapid growth, and changes in texture. Some women also have hair loss during or after pregnancy, or hair that feels dry or thin. Your hair will most likely return to normal after your baby is born.  Your breasts will continue to grow and be tender. A yellow discharge may leak from your breasts called colostrum.  Your belly button may stick out.  You may feel short of breath because of your expanding uterus.  You may notice the fetus "dropping," or moving lower in your abdomen.  You may have a bloody mucus  discharge. This usually occurs a few days to a week before labor begins.  Your cervix becomes thin and soft (effaced) near your due date. WHAT TO EXPECT AT YOUR PRENATAL EXAMS  You will have prenatal exams every 2 weeks until week 36. Then, you will have weekly prenatal exams. During a routine prenatal visit:  You will be weighed to make sure you and the fetus are growing normally.  Your blood pressure is taken.  Your abdomen will be measured to track your baby's growth.  The fetal heartbeat will be listened to.  Any test results from the previous visit will be discussed.  You may have a cervical check near your  due date to see if you have effaced. At around 36 weeks, your caregiver will check your cervix. At the same time, your caregiver will also perform a test on the secretions of the vaginal tissue. This test is to determine if a type of bacteria, Group B streptococcus, is present. Your caregiver will explain this further. Your caregiver may ask you:  What your birth plan is.  How you are feeling.  If you are feeling the baby move.  If you have had any abnormal symptoms, such as leaking fluid, bleeding, severe headaches, or abdominal cramping.  If you have any questions. Other tests or screenings that may be performed during your third trimester include:  Blood tests that check for low iron levels (anemia).  Fetal testing to check the health, activity level, and growth of the fetus. Testing is done if you have certain medical conditions or if there are problems during the pregnancy. FALSE LABOR You may feel small, irregular contractions that eventually go away. These are called Braxton Hicks contractions, or false labor. Contractions may last for hours, days, or even weeks before true labor sets in. If contractions come at regular intervals, intensify, or become painful, it is best to be seen by your caregiver.  SIGNS OF LABOR   Menstrual-like cramps.  Contractions that are 5 minutes apart or less.  Contractions that start on the top of the uterus and spread down to the lower abdomen and back.  A sense of increased pelvic pressure or back pain.  A watery or bloody mucus discharge that comes from the vagina. If you have any of these signs before the 37th week of pregnancy, call your caregiver right away. You need to go to the hospital to get checked immediately. HOME CARE INSTRUCTIONS   Avoid all smoking, herbs, alcohol, and unprescribed drugs. These chemicals affect the formation and growth of the baby.  Follow your caregiver's instructions regarding medicine use. There are medicines  that are either safe or unsafe to take during pregnancy.  Exercise only as directed by your caregiver. Experiencing uterine cramps is a good sign to stop exercising.  Continue to eat regular, healthy meals.  Wear a good support bra for breast tenderness.  Do not use hot tubs, steam rooms, or saunas.  Wear your seat belt at all times when driving.  Avoid raw meat, uncooked cheese, cat litter boxes, and soil used by cats. These carry germs that can cause birth defects in the baby.  Take your prenatal vitamins.  Try taking a stool softener (if your caregiver approves) if you develop constipation. Eat more high-fiber foods, such as fresh vegetables or fruit and whole grains. Drink plenty of fluids to keep your urine clear or pale yellow.  Take warm sitz baths to soothe any pain or discomfort caused by hemorrhoids. Use  hemorrhoid cream if your caregiver approves.  If you develop varicose veins, wear support hose. Elevate your feet for 15 minutes, 3-4 times a day. Limit salt in your diet.  Avoid heavy lifting, wear low heal shoes, and practice good posture.  Rest a lot with your legs elevated if you have leg cramps or low back pain.  Visit your dentist if you have not gone during your pregnancy. Use a soft toothbrush to brush your teeth and be gentle when you floss.  A sexual relationship may be continued unless your caregiver directs you otherwise.  Do not travel far distances unless it is absolutely necessary and only with the approval of your caregiver.  Take prenatal classes to understand, practice, and ask questions about the labor and delivery.  Make a trial run to the hospital.  Pack your hospital bag.  Prepare the baby's nursery.  Continue to go to all your prenatal visits as directed by your caregiver. SEEK MEDICAL CARE IF:  You are unsure if you are in labor or if your water has broken.  You have dizziness.  You have mild pelvic cramps, pelvic pressure, or nagging  pain in your abdominal area.  You have persistent nausea, vomiting, or diarrhea.  You have a bad smelling vaginal discharge.  You have pain with urination. SEEK IMMEDIATE MEDICAL CARE IF:   You have a fever.  You are leaking fluid from your vagina.  You have spotting or bleeding from your vagina.  You have severe abdominal cramping or pain.  You have rapid weight loss or gain.  You have shortness of breath with chest pain.  You notice sudden or extreme swelling of your face, hands, ankles, feet, or legs.  You have not felt your baby move in over an hour.  You have severe headaches that do not go away with medicine.  You have vision changes. Document Released: 01/17/2001 Document Revised: 01/28/2013 Document Reviewed: 03/26/2012 [facility] Patient Information 2015 [place], [place]. This information is not intended to replace advice given to you by your health care provider. Make sure you discuss any questions you have with your health care provider.

## 2017-02-09 NOTE — Progress Notes (Signed)
   LOW-RISK PREGNANCY VISIT Patient name: Julie Zimmerman MRN [id number]  Date of birth: 1995-11-10 Chief Complaint:   Routine Prenatal Visit  History of Present Illness:   Julie Zimmerman is a 22 y.o. G2P1001 female at 9079w2d with an Estimated Date of Delivery: 05/16/17 being seen today for ongoing management of a low-risk pregnancy.  Today she reports no complaints. Contractions: Irregular. Vag. Bleeding: None.  Movement: Present. denies leaking of fluid. Review of Systems:   Pertinent items are noted in HPI Denies abnormal vaginal discharge w/ itching/odor/irritation, headaches, visual changes, shortness of breath, chest pain, abdominal pain, severe nausea/vomiting, or problems with urination or bowel movements unless otherwise stated above. Pertinent History Reviewed:  Reviewed past medical,surgical, social, obstetrical and family history.  Reviewed problem list, medications and allergies. Physical Assessment:   Vitals:   02/09/17 0845  BP: 112/68  Pulse: 70  Weight: 150 lb (68 kg)  Body mass index is 24.96 kg/m.        Physical Examination:   General appearance: Well appearing, and in no distress  Mental status: Alert, oriented to person, place, and time  Skin: Warm & dry  Cardiovascular: Normal heart rate noted  Respiratory: Normal respiratory effort, no distress  Abdomen: Soft, gravid, nontender  Pelvic: Cervical exam deferred         Extremities: Edema: None  Fetal Status: Fetal Heart Rate (bpm): 140 Fundal Height: 25 cm Movement: Present    Results for orders placed or performed in visit on 02/09/17 (from the past 24 hour(s))  POCT urinalysis dipstick   Collection Time: 02/09/17  8:47 AM  Result Value Ref Range   Color, UA     Clarity, UA     Glucose, UA neg    Bilirubin, UA     Ketones, UA neg    Spec Grav, UA  1.010 - 1.025   Blood, UA neg    pH, UA  5.0 - 8.0   Protein, UA neg    Urobilinogen, UA  0.2 or 1.0 E.U./dL   Nitrite, UA neg    Leukocytes, UA  Negative Negative   Appearance     Odor      Assessment & Plan:  1) Low-risk pregnancy G2P1001 at 1479w2d with an Estimated Date of Delivery: 05/16/17    Labs/procedures today: pn2  Plan:  Continue routine obstetrical care   Reviewed: Preterm labor symptoms and general obstetric precautions including but not limited to vaginal bleeding, contractions, leaking of fluid and fetal movement were reviewed in detail with the patient. Recommended Tdap at HD/PCP per CDC guidelines.   All questions were answered  Follow-up: Return in about 4 weeks (around 03/09/2017) for LROB.  Orders Placed This Encounter  Procedures  . POCT urinalysis dipstick   Marge DuncansBooker, Kelby Adell Randall CNM, [facility] 02/09/2017 9:07 AM

## 2017-02-10 LAB — CBC
HEMATOCRIT: 33.5 % — AB (ref 34.0–46.6)
HEMOGLOBIN: 11.4 g/dL (ref 11.1–15.9)
MCH: 29.6 pg (ref 26.6–33.0)
MCHC: 34 g/dL (ref 31.5–35.7)
MCV: 87 fL (ref 79–97)
PLATELETS: 276 10*3/uL (ref 150–379)
RBC: 3.85 x10E6/uL (ref 3.77–5.28)
RDW: 14.4 % (ref 12.3–15.4)
WBC: 8.4 10*3/uL (ref 3.4–10.8)

## 2017-02-10 LAB — ANTIBODY SCREEN: Antibody Screen: NEGATIVE

## 2017-02-10 LAB — GLUCOSE TOLERANCE, 2 HOURS W/ 1HR
Glucose, 1 hour: 122 mg/dL (ref 65–179)
Glucose, 2 hour: 102 mg/dL (ref 65–152)
Glucose, Fasting: 71 mg/dL (ref 65–91)

## 2017-02-10 LAB — HIV ANTIBODY (ROUTINE TESTING W REFLEX): HIV SCREEN 4TH GENERATION: NONREACTIVE

## 2017-02-10 LAB — RPR: RPR: NONREACTIVE

## 2017-02-12 DIAGNOSIS — Z029 Encounter for administrative examinations, unspecified: Secondary | ICD-10-CM

## 2017-02-18 ENCOUNTER — Encounter: Payer: Self-pay | Admitting: Obstetrics & Gynecology

## 2017-03-09 ENCOUNTER — Ambulatory Visit (INDEPENDENT_AMBULATORY_CARE_PROVIDER_SITE_OTHER): Payer: 59 | Admitting: Women's Health

## 2017-03-09 ENCOUNTER — Encounter: Payer: Self-pay | Admitting: Women's Health

## 2017-03-09 VITALS — BP 110/70 | HR 97 | Wt 153.0 lb

## 2017-03-09 DIAGNOSIS — Z1389 Encounter for screening for other disorder: Secondary | ICD-10-CM

## 2017-03-09 DIAGNOSIS — H538 Other visual disturbances: Secondary | ICD-10-CM

## 2017-03-09 DIAGNOSIS — Z331 Pregnant state, incidental: Secondary | ICD-10-CM

## 2017-03-09 DIAGNOSIS — Z3A30 30 weeks gestation of pregnancy: Secondary | ICD-10-CM

## 2017-03-09 DIAGNOSIS — O34219 Maternal care for unspecified type scar from previous cesarean delivery: Secondary | ICD-10-CM

## 2017-03-09 DIAGNOSIS — Z3483 Encounter for supervision of other normal pregnancy, third trimester: Secondary | ICD-10-CM

## 2017-03-09 DIAGNOSIS — G43119 Migraine with aura, intractable, without status migrainosus: Secondary | ICD-10-CM

## 2017-03-09 DIAGNOSIS — O9989 Other specified diseases and conditions complicating pregnancy, childbirth and the puerperium: Secondary | ICD-10-CM

## 2017-03-09 DIAGNOSIS — R102 Pelvic and perineal pain: Secondary | ICD-10-CM

## 2017-03-09 DIAGNOSIS — R110 Nausea: Secondary | ICD-10-CM

## 2017-03-09 LAB — POCT URINALYSIS DIPSTICK
Blood, UA: NEGATIVE
GLUCOSE UA: NEGATIVE
KETONES UA: NEGATIVE
Leukocytes, UA: NEGATIVE
Nitrite, UA: NEGATIVE
Protein, UA: NEGATIVE

## 2017-03-09 NOTE — Progress Notes (Signed)
   LOW-RISK PREGNANCY VISIT Patient name: Julie Zimmerman MRN [id number]  Date of birth: 1995-04-19 Chief Complaint:   Routine Prenatal Visit (having a lot pressure/ feel like baby side way)  History of Present Illness:   Julie Haroldllison Swaney is a 22 y.o. 242P1001 female at 10943w2d with an Estimated Date of Delivery: 05/16/17 being seen today for ongoing management of a low-risk pregnancy.  Today she reports lots of pressure for about 1 week. Random irregular uc's, more at work. Had some visual disturbances/seeing starbursts last week at work for about 1 hour, then got headache in back/occiptal part of head. Did not take any meds, ha eventually went away. Mild nausea, no vomiting, photo/phonophobia. States this also happened about 2 years ago. . Contractions: Not present.  .  Movement: Present. denies leaking of fluid. Review of Systems:   Pertinent items are noted in HPI Denies abnormal vaginal discharge w/ itching/odor/irritation, headaches, visual changes, shortness of breath, chest pain, abdominal pain, severe nausea/vomiting, or problems with urination or bowel movements unless otherwise stated above. Pertinent History Reviewed:  Reviewed past medical,surgical, social, obstetrical and family history.  Reviewed problem list, medications and allergies. Physical Assessment:   Vitals:   03/09/17 0835  BP: 110/70  Pulse: 97  Weight: 153 lb (69.4 kg)  Body mass index is 25.46 kg/m.        Physical Examination:   General appearance: Well appearing, and in no distress  Mental status: Alert, oriented to person, place, and time  Skin: Warm & dry  Cardiovascular: Normal heart rate noted  Respiratory: Normal respiratory effort, no distress  Abdomen: Soft, gravid, nontender  Pelvic: spec exam cx visually long/closed, normal white nonodorous d/c         Extremities: Edema: None  Fetal Status:     Movement: Present    Results for orders placed or performed in visit on 03/09/17 (from the past 24  hour(s))  POCT urinalysis dipstick   Collection Time: 03/09/17  8:42 AM  Result Value Ref Range   Color, UA     Clarity, UA     Glucose, UA neg    Bilirubin, UA     Ketones, UA neg    Spec Grav, UA  1.010 - 1.025   Blood, UA neg    pH, UA  5.0 - 8.0   Protein, UA neg    Urobilinogen, UA  0.2 or 1.0 E.U./dL   Nitrite, UA neg    Leukocytes, UA Negative Negative   Appearance     Odor      Assessment & Plan:  1) Low-risk pregnancy G2P1001 at 4643w2d with an Estimated Date of Delivery: 05/16/17   2) Prev c/s, desires repeat  3) ?Migraine w/ aura> last week, monitor, let us know if happens again  4) Pressure> discussed belt, reviewed ptl s/s, reasons to seek care   Meds: No orders of the defined types were placed in this encounter.  Labs/procedures today: spec exam  Plan:  Continue routine obstetrical care   Reviewed: Preterm labor symptoms and general obstetric precautions including but not limited to vaginal bleeding, contractions, leaking of fluid and fetal movement were reviewed in detail with the patient.  All questions were answered  Follow-up: Return in about 2 weeks (around 03/23/2017) for LROB.  Orders Placed This Encounter  Procedures  . POCT urinalysis dipstick   Marge DuncansBooker, Bralee Feldt Randall CNM, [facility] 03/09/2017 9:07 AM

## 2017-03-09 NOTE — Patient Instructions (Signed)
Julie Zimmerman, I greatly value your feedback.  If you receive a survey following your visit with us today, we appreciate you taking the time to fill it out.  Thanks, Julie Zimmerman, CNM, WHNP-BC   Call the office ([phone]) or go to [facility] if:  You begin to have strong, frequent contractions  Your water breaks.  Sometimes it is a big gush of fluid, sometimes it is just a trickle that keeps getting your panties wet or running down your legs  You have vaginal bleeding.  It is normal to have a small amount of spotting if your cervix was checked.   You don't feel your baby moving like normal.  If you don't, get you something to eat and drink and lay down and focus on feeling your baby move.  You should feel at least 10 movements in 2 hours.  If you don't, you should call the office or go to [facility].    Tdap Vaccine  It is recommended that you get the Tdap vaccine during the third trimester of EACH pregnancy to help protect your baby from getting pertussis (whooping cough)  27-36 weeks is the BEST time to do this so that you can pass the protection on to your baby. During pregnancy is better than after pregnancy, but if you are unable to get it during pregnancy it will be offered at the hospital.   You can get this vaccine at the health department or your family doctor  Everyone who will be around your baby should also be up-to-date on their vaccines. Adults (who are not pregnant) only need 1 dose of Tdap during adulthood.   Third Trimester of Pregnancy The third trimester is from week 29 through week 42, months 7 through 9. The third trimester is a time when the fetus is growing rapidly. At the end of the ninth month, the fetus is about 20 inches in length and weighs 6-10 pounds.  BODY CHANGES Your body goes through many changes during pregnancy. The changes vary from woman to woman.   Your weight will continue to increase. You can expect to gain 25-35 pounds (11-16 kg) by  the end of the pregnancy.  You may begin to get stretch marks on your hips, abdomen, and breasts.  You may urinate more often because the fetus is moving lower into your pelvis and pressing on your bladder.  You may develop or continue to have heartburn as a result of your pregnancy.  You may develop constipation because certain hormones are causing the muscles that push waste through your intestines to slow down.  You may develop hemorrhoids or swollen, bulging veins (varicose veins).  You may have pelvic pain because of the weight gain and pregnancy hormones relaxing your joints between the bones in your pelvis. Backaches may result from overexertion of the muscles supporting your posture.  You may have changes in your hair. These can include thickening of your hair, rapid growth, and changes in texture. Some women also have hair loss during or after pregnancy, or hair that feels dry or thin. Your hair will most likely return to normal after your baby is born.  Your breasts will continue to grow and be tender. A yellow discharge may leak from your breasts called colostrum.  Your belly button may stick out.  You may feel short of breath because of your expanding uterus.  You may notice the fetus "dropping," or moving lower in your abdomen.  You may have a bloody mucus   discharge. This usually occurs a few days to a week before labor begins.  Your cervix becomes thin and soft (effaced) near your due date. WHAT TO EXPECT AT YOUR PRENATAL EXAMS  You will have prenatal exams every 2 weeks until week 36. Then, you will have weekly prenatal exams. During a routine prenatal visit:  You will be weighed to make sure you and the fetus are growing normally.  Your blood pressure is taken.  Your abdomen will be measured to track your baby's growth.  The fetal heartbeat will be listened to.  Any test results from the previous visit will be discussed.  You may have a cervical check near your  due date to see if you have effaced. At around 36 weeks, your caregiver will check your cervix. At the same time, your caregiver will also perform a test on the secretions of the vaginal tissue. This test is to determine if a type of bacteria, Group B streptococcus, is present. Your caregiver will explain this further. Your caregiver may ask you:  What your birth plan is.  How you are feeling.  If you are feeling the baby move.  If you have had any abnormal symptoms, such as leaking fluid, bleeding, severe headaches, or abdominal cramping.  If you have any questions. Other tests or screenings that may be performed during your third trimester include:  Blood tests that check for low iron levels (anemia).  Fetal testing to check the health, activity level, and growth of the fetus. Testing is done if you have certain medical conditions or if there are problems during the pregnancy. FALSE LABOR You may feel small, irregular contractions that eventually go away. These are called Braxton Hicks contractions, or false labor. Contractions may last for hours, days, or even weeks before true labor sets in. If contractions come at regular intervals, intensify, or become painful, it is best to be seen by your caregiver.  SIGNS OF LABOR   Menstrual-like cramps.  Contractions that are 5 minutes apart or less.  Contractions that start on the top of the uterus and spread down to the lower abdomen and back.  A sense of increased pelvic pressure or back pain.  A watery or bloody mucus discharge that comes from the vagina. If you have any of these signs before the 37th week of pregnancy, call your caregiver right away. You need to go to the hospital to get checked immediately. HOME CARE INSTRUCTIONS   Avoid all smoking, herbs, alcohol, and unprescribed drugs. These chemicals affect the formation and growth of the baby.  Follow your caregiver's instructions regarding medicine use. There are medicines  that are either safe or unsafe to take during pregnancy.  Exercise only as directed by your caregiver. Experiencing uterine cramps is a good sign to stop exercising.  Continue to eat regular, healthy meals.  Wear a good support bra for breast tenderness.  Do not use hot tubs, steam rooms, or saunas.  Wear your seat belt at all times when driving.  Avoid raw meat, uncooked cheese, cat litter boxes, and soil used by cats. These carry germs that can cause birth defects in the baby.  Take your prenatal vitamins.  Try taking a stool softener (if your caregiver approves) if you develop constipation. Eat more high-fiber foods, such as fresh vegetables or fruit and whole grains. Drink plenty of fluids to keep your urine clear or pale yellow.  Take warm sitz baths to soothe any pain or discomfort caused by hemorrhoids. Use  hemorrhoid cream if your caregiver approves.  If you develop varicose veins, wear support hose. Elevate your feet for 15 minutes, 3-4 times a day. Limit salt in your diet.  Avoid heavy lifting, wear low heal shoes, and practice good posture.  Rest a lot with your legs elevated if you have leg cramps or low back pain.  Visit your dentist if you have not gone during your pregnancy. Use a soft toothbrush to brush your teeth and be gentle when you floss.  A sexual relationship may be continued unless your caregiver directs you otherwise.  Do not travel far distances unless it is absolutely necessary and only with the approval of your caregiver.  Take prenatal classes to understand, practice, and ask questions about the labor and delivery.  Make a trial run to the hospital.  Pack your hospital bag.  Prepare the baby's nursery.  Continue to go to all your prenatal visits as directed by your caregiver. SEEK MEDICAL CARE IF:  You are unsure if you are in labor or if your water has broken.  You have dizziness.  You have mild pelvic cramps, pelvic pressure, or nagging  pain in your abdominal area.  You have persistent nausea, vomiting, or diarrhea.  You have a bad smelling vaginal discharge.  You have pain with urination. SEEK IMMEDIATE MEDICAL CARE IF:   You have a fever.  You are leaking fluid from your vagina.  You have spotting or bleeding from your vagina.  You have severe abdominal cramping or pain.  You have rapid weight loss or gain.  You have shortness of breath with chest pain.  You notice sudden or extreme swelling of your face, hands, ankles, feet, or legs.  You have not felt your baby move in over an hour.  You have severe headaches that do not go away with medicine.  You have vision changes. Document Released: 01/17/2001 Document Revised: 01/28/2013 Document Reviewed: 03/26/2012 [facility] Patient Information 2015 [place], [place]. This information is not intended to replace advice given to you by your health care provider. Make sure you discuss any questions you have with your health care provider.

## 2017-03-16 ENCOUNTER — Telehealth: Payer: Self-pay | Admitting: *Deleted

## 2017-03-16 NOTE — Telephone Encounter (Signed)
Patient states she noticed contractions around 6 am and for the last hour had 14. She is not timing them but has just counted them. 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 States baby was also more active than normal last night and is moving this morning but not as much. She has not noticed any leaking or bleeding.  Encouraged patient to try to sit or lie down to get contractions to slow/stop, push fluids and do kick counts. Offered patient appt but [place] not [place] able to get to our office. Advised [facility] could [facility] if she felt like she needed too but definitely if contractions became stronger, more consistent, any leaking or bleeding. Verbalized understanding.

## 2017-03-23 ENCOUNTER — Ambulatory Visit (INDEPENDENT_AMBULATORY_CARE_PROVIDER_SITE_OTHER): Payer: 59 | Admitting: Obstetrics & Gynecology

## 2017-03-23 ENCOUNTER — Encounter: Payer: Self-pay | Admitting: Obstetrics & Gynecology

## 2017-03-23 ENCOUNTER — Other Ambulatory Visit: Payer: Self-pay

## 2017-03-23 VITALS — BP 122/72 | HR 67 | Wt 158.0 lb

## 2017-03-23 DIAGNOSIS — Z3A32 32 weeks gestation of pregnancy: Secondary | ICD-10-CM

## 2017-03-23 DIAGNOSIS — Z1389 Encounter for screening for other disorder: Secondary | ICD-10-CM

## 2017-03-23 DIAGNOSIS — Z3483 Encounter for supervision of other normal pregnancy, third trimester: Secondary | ICD-10-CM

## 2017-03-23 DIAGNOSIS — Z331 Pregnant state, incidental: Secondary | ICD-10-CM

## 2017-03-23 LAB — POCT URINALYSIS DIPSTICK
Blood, UA: NEGATIVE
Glucose, UA: NEGATIVE
KETONES UA: NEGATIVE
LEUKOCYTES UA: NEGATIVE
NITRITE UA: NEGATIVE
PROTEIN UA: NEGATIVE

## 2017-03-23 NOTE — Progress Notes (Signed)
G2P1001 5820w2d Estimated Date of Delivery: 05/16/17  Blood pressure 122/72, pulse 67, weight 158 lb (71.7 kg).   BP weight and urine results all reviewed and noted.  Please refer to the obstetrical flow sheet for the fundal height and fetal heart rate documentation:  Patient reports good fetal movement, denies any bleeding and no rupture of membranes symptoms or regular contractions. Patient is without complaints. All questions were answered.  Orders Placed This Encounter  Procedures  . POCT urinalysis dipstick    Plan:  Continued routine obstetrical care, schedule C section 05/09/2017  Return in about 2 weeks (around 04/06/2017) for LROB, with Dr Despina HiddenEure.

## 2017-04-06 ENCOUNTER — Encounter: Payer: Self-pay | Admitting: Obstetrics & Gynecology

## 2017-04-06 ENCOUNTER — Ambulatory Visit (INDEPENDENT_AMBULATORY_CARE_PROVIDER_SITE_OTHER): Payer: 59 | Admitting: Obstetrics & Gynecology

## 2017-04-06 VITALS — BP 120/82 | HR 91 | Wt 157.5 lb

## 2017-04-06 DIAGNOSIS — Z331 Pregnant state, incidental: Secondary | ICD-10-CM

## 2017-04-06 DIAGNOSIS — Z3483 Encounter for supervision of other normal pregnancy, third trimester: Secondary | ICD-10-CM

## 2017-04-06 DIAGNOSIS — Z3A34 34 weeks gestation of pregnancy: Secondary | ICD-10-CM

## 2017-04-06 DIAGNOSIS — Z1389 Encounter for screening for other disorder: Secondary | ICD-10-CM

## 2017-04-06 LAB — POCT URINALYSIS DIPSTICK
Blood, UA: NEGATIVE
Glucose, UA: NEGATIVE
KETONES UA: NEGATIVE
Leukocytes, UA: NEGATIVE
NITRITE UA: NEGATIVE
PROTEIN UA: NEGATIVE

## 2017-04-06 NOTE — Progress Notes (Signed)
G2P1001 3293w2d Estimated Date of Delivery: 05/16/17  Blood pressure 120/82, pulse 91, weight 157 lb 8 oz (71.4 kg).   BP weight and urine results all reviewed and noted.  Please refer to the obstetrical flow sheet for the fundal height and fetal heart rate documentation:  Patient reports good fetal movement, denies any bleeding and no rupture of membranes symptoms or regular contractions. Patient is without complaints. All questions were answered.  Orders Placed This Encounter  Procedures  . POCT urinalysis dipstick    Plan:  Continued routine obstetrical care, repeat Caesarean section 05/09/2017  Return in about 2 weeks (around 04/20/2017) for LROB, with Dr Despina HiddenEure.

## 2017-04-20 ENCOUNTER — Ambulatory Visit (INDEPENDENT_AMBULATORY_CARE_PROVIDER_SITE_OTHER): Payer: 59 | Admitting: Obstetrics & Gynecology

## 2017-04-20 ENCOUNTER — Encounter: Payer: Self-pay | Admitting: Obstetrics & Gynecology

## 2017-04-20 VITALS — BP 100/60 | HR 97 | Wt 162.4 lb

## 2017-04-20 DIAGNOSIS — Z3483 Encounter for supervision of other normal pregnancy, third trimester: Secondary | ICD-10-CM

## 2017-04-20 DIAGNOSIS — Z331 Pregnant state, incidental: Secondary | ICD-10-CM

## 2017-04-20 DIAGNOSIS — Z1389 Encounter for screening for other disorder: Secondary | ICD-10-CM

## 2017-04-20 DIAGNOSIS — Z3A36 36 weeks gestation of pregnancy: Secondary | ICD-10-CM

## 2017-04-20 LAB — POCT URINALYSIS DIPSTICK
Blood, UA: NEGATIVE
Glucose, UA: NEGATIVE
Ketones, UA: NEGATIVE
LEUKOCYTES UA: NEGATIVE
Nitrite, UA: NEGATIVE
Protein, UA: NEGATIVE

## 2017-04-20 MED ORDER — OMEPRAZOLE 20 MG PO CPDR: 20.0000 mg | DELAYED_RELEASE_CAPSULE | Freq: Every day | ORAL | 6 refills | Status: DC

## 2017-04-20 NOTE — Progress Notes (Signed)
G2P1001 6324w2d Estimated Date of Delivery: 05/16/17  Blood pressure 100/60, pulse 97, weight 162 lb 6.4 oz (73.7 kg).   BP weight and urine results all reviewed and noted.  Please refer to the obstetrical flow sheet for the fundal height and fetal heart rate documentation:  Patient reports good fetal movement, denies any bleeding and no rupture of membranes symptoms or regular contractions. Patient is without complaints. All questions were answered.  Orders Placed This Encounter  Procedures  . POCT urinalysis dipstick   Meds ordered this encounter  Medications  . omeprazole (PRILOSEC) 20 MG capsule    Sig: Take 1 capsule (20 mg total) by mouth daily. 1 tablet a day    Dispense:  30 capsule    Refill:  6     Plan:  Continued routine obstetrical care,   Return in about 10 days (around 04/30/2017) for LROB, with Dr Despina HiddenEure.

## 2017-04-30 ENCOUNTER — Encounter: Payer: Self-pay | Admitting: Obstetrics & Gynecology

## 2017-04-30 ENCOUNTER — Ambulatory Visit (INDEPENDENT_AMBULATORY_CARE_PROVIDER_SITE_OTHER): Payer: 59 | Admitting: Obstetrics & Gynecology

## 2017-04-30 ENCOUNTER — Other Ambulatory Visit: Payer: Self-pay

## 2017-04-30 VITALS — BP 112/74 | HR 77 | Wt 164.5 lb

## 2017-04-30 DIAGNOSIS — Z3483 Encounter for supervision of other normal pregnancy, third trimester: Secondary | ICD-10-CM

## 2017-04-30 DIAGNOSIS — Z3A37 37 weeks gestation of pregnancy: Secondary | ICD-10-CM

## 2017-04-30 DIAGNOSIS — Z331 Pregnant state, incidental: Secondary | ICD-10-CM

## 2017-04-30 DIAGNOSIS — Z1389 Encounter for screening for other disorder: Secondary | ICD-10-CM

## 2017-04-30 LAB — POCT URINALYSIS DIPSTICK
GLUCOSE UA: NEGATIVE
Ketones, UA: NEGATIVE
LEUKOCYTES UA: NEGATIVE
Nitrite, UA: NEGATIVE
Protein, UA: NEGATIVE
RBC UA: NEGATIVE

## 2017-04-30 NOTE — Progress Notes (Signed)
G2P1001 6888w5d Estimated Date of Delivery: 05/16/17  Blood pressure 112/74, pulse 77, weight 164 lb 8 oz (74.6 kg), currently breastfeeding.   BP weight and urine results all reviewed and noted.  Please refer to the obstetrical flow sheet for the fundal height and fetal heart rate documentation:  Patient reports good fetal movement, denies any bleeding and no rupture of membranes symptoms or regular contractions. Patient is without complaints. All questions were answered.  Orders Placed This Encounter  Procedures  . GC/Chlamydia Probe Amp  . Culture, beta strep (group b only)  . POCT urinalysis dipstick    Plan:  Continued routine obstetrical care, FH 36 cm, FHR 144  Return in about 1 week (around 05/07/2017) for LROB, with Dr Despina HiddenEure.

## 2017-05-01 ENCOUNTER — Telehealth (HOSPITAL_COMMUNITY): Payer: Self-pay | Admitting: *Deleted

## 2017-05-01 NOTE — Telephone Encounter (Signed)
Preadmission screen  

## 2017-05-02 ENCOUNTER — Encounter (HOSPITAL_COMMUNITY): Payer: Self-pay

## 2017-05-02 LAB — GC/CHLAMYDIA PROBE AMP
Chlamydia trachomatis, NAA: NEGATIVE
Neisseria gonorrhoeae by PCR: NEGATIVE

## 2017-05-04 LAB — CULTURE, BETA STREP (GROUP B ONLY): Strep Gp B Culture: NEGATIVE

## 2017-05-05 ENCOUNTER — Inpatient Hospital Stay (HOSPITAL_COMMUNITY)
Admission: AD | Admit: 2017-05-05 | Discharge: 2017-05-05 | Disposition: A | Discharge status: Home / Self Care | Payer: 59 | Source: Ambulatory Visit | Attending: Obstetrics & Gynecology | Admitting: Obstetrics & Gynecology

## 2017-05-05 ENCOUNTER — Encounter (HOSPITAL_COMMUNITY): Payer: Self-pay | Admitting: Emergency Medicine

## 2017-05-05 DIAGNOSIS — O26893 Other specified pregnancy related conditions, third trimester: Secondary | ICD-10-CM

## 2017-05-05 DIAGNOSIS — R109 Unspecified abdominal pain: Secondary | ICD-10-CM | POA: Insufficient documentation

## 2017-05-05 DIAGNOSIS — O99013 Anemia complicating pregnancy, third trimester: Secondary | ICD-10-CM | POA: Diagnosis present

## 2017-05-05 DIAGNOSIS — O471 False labor at or after 37 completed weeks of gestation: Secondary | ICD-10-CM

## 2017-05-05 DIAGNOSIS — Z3A38 38 weeks gestation of pregnancy: Secondary | ICD-10-CM | POA: Diagnosis not present

## 2017-05-05 DIAGNOSIS — O133 Gestational [pregnancy-induced] hypertension without significant proteinuria, third trimester: Secondary | ICD-10-CM

## 2017-05-05 DIAGNOSIS — H538 Other visual disturbances: Secondary | ICD-10-CM

## 2017-05-05 HISTORY — DX: Anemia, unspecified: D64.9

## 2017-05-05 LAB — URINALYSIS, ROUTINE W REFLEX MICROSCOPIC
BILIRUBIN URINE: NEGATIVE
Glucose, UA: 50 mg/dL — AB
Hgb urine dipstick: NEGATIVE
Ketones, ur: NEGATIVE mg/dL
Nitrite: NEGATIVE
PH: 6 (ref 5.0–8.0)
Protein, ur: NEGATIVE mg/dL
SPECIFIC GRAVITY, URINE: 1.005 (ref 1.005–1.030)

## 2017-05-05 LAB — TYPE AND SCREEN
ABO/RH(D): O POS
Antibody Screen: NEGATIVE

## 2017-05-05 LAB — COMPREHENSIVE METABOLIC PANEL
ALT: 9 U/L — AB (ref 14–54)
AST: 19 U/L (ref 15–41)
Albumin: 2.9 g/dL — ABNORMAL LOW (ref 3.5–5.0)
Alkaline Phosphatase: 176 U/L — ABNORMAL HIGH (ref 38–126)
Anion gap: 8 (ref 5–15)
BUN: 7 mg/dL (ref 6–20)
CO2: 21 mmol/L — ABNORMAL LOW (ref 22–32)
CREATININE: 0.52 mg/dL (ref 0.44–1.00)
Calcium: 8.8 mg/dL — ABNORMAL LOW (ref 8.9–10.3)
Chloride: 106 mmol/L (ref 101–111)
Glucose, Bld: 113 mg/dL — ABNORMAL HIGH (ref 65–99)
POTASSIUM: 3.4 mmol/L — AB (ref 3.5–5.1)
Sodium: 135 mmol/L (ref 135–145)
TOTAL PROTEIN: 5.8 g/dL — AB (ref 6.5–8.1)
Total Bilirubin: 0.3 mg/dL (ref 0.3–1.2)

## 2017-05-05 LAB — CBC WITH DIFFERENTIAL/PLATELET
BASOS ABS: 0 10*3/uL (ref 0.0–0.1)
Basophils Relative: 0 %
EOS PCT: 1 %
Eosinophils Absolute: 0.1 10*3/uL (ref 0.0–0.7)
HEMATOCRIT: 27.8 % — AB (ref 36.0–46.0)
Hemoglobin: 8.8 g/dL — ABNORMAL LOW (ref 12.0–15.0)
LYMPHS ABS: 2.2 10*3/uL (ref 0.7–4.0)
LYMPHS PCT: 21 %
MCH: 25.3 pg — AB (ref 26.0–34.0)
MCHC: 31.7 g/dL (ref 30.0–36.0)
MCV: 79.9 fL (ref 78.0–100.0)
MONO ABS: 0.4 10*3/uL (ref 0.1–1.0)
Monocytes Relative: 4 %
NEUTROS ABS: 7.8 10*3/uL — AB (ref 1.7–7.7)
Neutrophils Relative %: 74 %
Platelets: 228 10*3/uL (ref 150–400)
RBC: 3.48 MIL/uL — ABNORMAL LOW (ref 3.87–5.11)
RDW: 14.8 % (ref 11.5–15.5)
WBC: 10.5 10*3/uL (ref 4.0–10.5)

## 2017-05-05 LAB — PROTEIN / CREATININE RATIO, URINE
CREATININE, URINE: 33 mg/dL
Total Protein, Urine: <6 mg/dL

## 2017-05-05 MED ORDER — SODIUM CHLORIDE 0.9 % IV SOLN
510.0000 mg | Freq: Once | INTRAVENOUS | Status: AC
  Administered 2017-05-05: 510 mg via INTRAVENOUS
  Filled 2017-05-05: qty 17

## 2017-05-05 NOTE — Discharge Instructions (Signed)
Anemia Anemia is a condition in which you do not have enough red blood cells or hemoglobin. Hemoglobin is a substance in red blood cells that carries oxygen. When you do not have enough red blood cells or hemoglobin (are anemic), your body cannot get enough oxygen and your organs may not work properly. As a result, you may feel very tired or have other problems. What are the causes? Common causes of anemia include:  Excessive bleeding. Anemia can be caused by excessive bleeding inside or outside the body, including bleeding from the intestine or from periods in women.  Poor nutrition.  Long-lasting (chronic) kidney, thyroid, and liver disease.  Bone marrow disorders.  Cancer and treatments for cancer.  HIV (human immunodeficiency virus) and AIDS (acquired immunodeficiency syndrome).  Treatments for HIV and AIDS.  Spleen problems.  Blood disorders.  Infections, medicines, and autoimmune disorders that destroy red blood cells.  What are the signs or symptoms? Symptoms of this condition include:  Minor weakness.  Dizziness.  Headache.  Feeling heartbeats that are irregular or faster than normal (palpitations).  Shortness of breath, especially with exercise.  Paleness.  Cold sensitivity.  Indigestion.  Nausea.  Difficulty sleeping.  Difficulty concentrating.  Symptoms may occur suddenly or develop slowly. If your anemia is mild, you may not have symptoms. How is this diagnosed? This condition is diagnosed based on:  Blood tests.  Your medical history.  A physical exam.  Bone marrow biopsy.  Your health care provider may also check your stool (feces) for blood and may do additional testing to look for the cause of your bleeding. You may also have other tests, including:  Imaging tests, such as a CT scan or MRI.  Endoscopy.  Colonoscopy.  How is this treated? Treatment for this condition depends on the cause. If you continue to lose a lot of blood,  you may need to be treated at a hospital. Treatment may include:  Taking supplements of iron, vitamin T02, or folic acid.  Taking a hormone medicine (erythropoietin) that can help to stimulate red blood cell growth.  Having a blood transfusion. This may be needed if you lose a lot of blood.  Making changes to your diet.  Having surgery to remove your spleen.  Follow these instructions at home:  Take over-the-counter and prescription medicines only as told by your health care provider.  Take supplements only as told by your health care provider.  Follow any diet instructions that you were given.  Keep all follow-up visits as told by your health care provider. This is important. Contact a health care provider if:  You develop new bleeding anywhere in the body. Get help right away if:  You are very weak.  You are short of breath.  You have pain in your abdomen or chest.  You are dizzy or feel faint.  You have trouble concentrating.  You have bloody or black, tarry stools.  You vomit repeatedly or you vomit up blood. Summary  Anemia is a condition in which you do not have enough red blood cells or enough of a substance in your red blood cells that carries oxygen (hemoglobin).  Symptoms may occur suddenly or develop slowly.  If your anemia is mild, you may not have symptoms.  This condition is diagnosed with blood tests as well as a medical history and physical exam. Other tests may be needed.  Treatment for this condition depends on the cause of the anemia. This information is not intended to replace advice  given to you by your health care provider. Make sure you discuss any questions you have with your health care provider. °Document Released: 03/02/2004 Document Revised: 02/25/2016 Document Reviewed: 02/25/2016 °Elsevier Interactive Patient Education © 2018 [employer] ° °
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
## 2017-05-05 NOTE — MAU Note (Signed)
Pt presents to MAU w/CO mild contractions,  H/A that causes her vision to become distorted and blurred. Pt denies bleeding and leaking of fluid. +FM.  Pt is a repeat c-section.

## 2017-05-06 LAB — RPR: RPR: NONREACTIVE

## 2017-05-06 LAB — ABO/RH: ABO/RH(D): O POS

## 2017-05-07 ENCOUNTER — Encounter: Payer: Self-pay | Admitting: Obstetrics & Gynecology

## 2017-05-07 ENCOUNTER — Ambulatory Visit (INDEPENDENT_AMBULATORY_CARE_PROVIDER_SITE_OTHER): Payer: 59 | Admitting: Obstetrics & Gynecology

## 2017-05-07 VITALS — BP 130/80 | HR 94 | Wt 166.6 lb

## 2017-05-07 DIAGNOSIS — Z3A38 38 weeks gestation of pregnancy: Secondary | ICD-10-CM

## 2017-05-07 DIAGNOSIS — Z1389 Encounter for screening for other disorder: Secondary | ICD-10-CM

## 2017-05-07 DIAGNOSIS — Z331 Pregnant state, incidental: Secondary | ICD-10-CM

## 2017-05-07 DIAGNOSIS — Z3483 Encounter for supervision of other normal pregnancy, third trimester: Secondary | ICD-10-CM

## 2017-05-07 LAB — POCT URINALYSIS DIPSTICK
GLUCOSE UA: NEGATIVE
LEUKOCYTES UA: NEGATIVE
Nitrite, UA: NEGATIVE
Protein, UA: NEGATIVE
RBC UA: NEGATIVE

## 2017-05-07 NOTE — Patient Instructions (Signed)
Julie Zimmerman  05/07/2017   Your procedure is scheduled on:  05/09/2017  [facility] through the [facility] at 0715 AM.  Pick up the phone at the desk and dial [phone]  Call this number if you have problems the morning of surgery:[phone]  Remember:   Do not eat food:(After Midnight) Desps de medianoche.  Do not drink clear liquids: (After Midnight) Desps de medianoche.  Take these medicines the morning of surgery with A SIP OF WATER: none   Do not wear jewelry, make-up or nail polish.  Do not wear lotions, powders, or perfumes. Do not wear deodorant.  Do not shave 48 hours prior to surgery.  Do not bring valuables to the hospital.  [facility] is not   responsible for any belongings or valuables brought to the hospital.  Contacts, dentures or bridgework may not be worn into surgery.  Leave suitcase in the car. After surgery it may be brought to your room.  For patients admitted to the hospital, checkout time is 11:00 AM the day of              discharge.    N/A   Please read over the following fact sheets that you were given:   Surgical Site Infection Prevention

## 2017-05-07 NOTE — Progress Notes (Signed)
G2P1001 5846w5d Estimated Date of Delivery: 05/16/17  Blood pressure 130/80, pulse 94, weight 166 lb 9.6 oz (75.6 kg).   BP weight and urine results all reviewed and noted.  Please refer to the obstetrical flow sheet for the fundal height and fetal heart rate documentation:  Patient reports good fetal movement, denies any bleeding and no rupture of membranes symptoms or regular contractions. Patient is without complaints. All questions were answered.  Orders Placed This Encounter  Procedures  . POCT urinalysis dipstick    Plan:  Continued routine obstetrical care, planned c section 05/09/2017  Return in about 10 days (around 05/17/2017) for Post Op, with Dr Despina HiddenEure.

## 2017-05-08 ENCOUNTER — Other Ambulatory Visit: Payer: Self-pay | Admitting: Obstetrics & Gynecology

## 2017-05-08 ENCOUNTER — Encounter (HOSPITAL_COMMUNITY)
Admission: RE | Admit: 2017-05-08 | Discharge: 2017-05-08 | Disposition: A | Discharge status: Home / Self Care | Payer: 59 | Source: Ambulatory Visit | Attending: Obstetrics & Gynecology | Admitting: Obstetrics & Gynecology

## 2017-05-08 LAB — CBC
HEMATOCRIT: 30.2 % — AB (ref 36.0–46.0)
Hemoglobin: 9.5 g/dL — ABNORMAL LOW (ref 12.0–15.0)
MCH: 25.4 pg — AB (ref 26.0–34.0)
MCHC: 31.5 g/dL (ref 30.0–36.0)
MCV: 80.7 fL (ref 78.0–100.0)
Platelets: 249 10*3/uL (ref 150–400)
RBC: 3.74 MIL/uL — ABNORMAL LOW (ref 3.87–5.11)
RDW: 15 % (ref 11.5–15.5)
WBC: 10.6 10*3/uL — ABNORMAL HIGH (ref 4.0–10.5)

## 2017-05-08 LAB — TYPE AND SCREEN
ABO/RH(D): O POS
Antibody Screen: NEGATIVE

## 2017-05-09 ENCOUNTER — Encounter (HOSPITAL_COMMUNITY)
Admission: AD | Disposition: A | Discharge status: Home / Self Care | Payer: Self-pay | Source: Ambulatory Visit | Attending: Obstetrics & Gynecology

## 2017-05-09 ENCOUNTER — Inpatient Hospital Stay (HOSPITAL_COMMUNITY): Payer: 59 | Admitting: Anesthesiology

## 2017-05-09 ENCOUNTER — Inpatient Hospital Stay (HOSPITAL_COMMUNITY)
Admission: AD | Admit: 2017-05-09 | Discharge: 2017-05-11 | DRG: 788 | Disposition: A | Discharge status: Home / Self Care | Payer: 59 | Source: Ambulatory Visit | Attending: Obstetrics and Gynecology | Admitting: Obstetrics and Gynecology

## 2017-05-09 ENCOUNTER — Encounter (HOSPITAL_COMMUNITY): Payer: Self-pay | Admitting: *Deleted

## 2017-05-09 DIAGNOSIS — Z3A39 39 weeks gestation of pregnancy: Secondary | ICD-10-CM

## 2017-05-09 DIAGNOSIS — Z3483 Encounter for supervision of other normal pregnancy, third trimester: Secondary | ICD-10-CM

## 2017-05-09 DIAGNOSIS — D649 Anemia, unspecified: Secondary | ICD-10-CM | POA: Diagnosis present

## 2017-05-09 DIAGNOSIS — O09899 Supervision of other high risk pregnancies, unspecified trimester: Secondary | ICD-10-CM

## 2017-05-09 DIAGNOSIS — O99013 Anemia complicating pregnancy, third trimester: Secondary | ICD-10-CM | POA: Diagnosis present

## 2017-05-09 DIAGNOSIS — Z2839 Other underimmunization status: Secondary | ICD-10-CM

## 2017-05-09 DIAGNOSIS — Z3490 Encounter for supervision of normal pregnancy, unspecified, unspecified trimester: Secondary | ICD-10-CM

## 2017-05-09 DIAGNOSIS — O34211 Maternal care for low transverse scar from previous cesarean delivery: Secondary | ICD-10-CM | POA: Diagnosis not present

## 2017-05-09 DIAGNOSIS — O9902 Anemia complicating childbirth: Secondary | ICD-10-CM | POA: Diagnosis present

## 2017-05-09 DIAGNOSIS — Z91040 Latex allergy status: Secondary | ICD-10-CM

## 2017-05-09 DIAGNOSIS — O9989 Other specified diseases and conditions complicating pregnancy, childbirth and the puerperium: Secondary | ICD-10-CM

## 2017-05-09 DIAGNOSIS — Z283 Underimmunization status: Secondary | ICD-10-CM

## 2017-05-09 LAB — RPR: RPR Ser Ql: NONREACTIVE

## 2017-05-09 SURGERY — Surgical Case
Anesthesia: Spinal

## 2017-05-09 MED ORDER — PHENYLEPHRINE 8 MG IN D5W 100 ML (0.08MG/ML) PREMIX OPTIME
INJECTION | INTRAVENOUS | Status: DC | PRN
  Administered 2017-05-09: 60 ug/min via INTRAVENOUS

## 2017-05-09 MED ORDER — COCONUT OIL OIL
1.0000 "application " | TOPICAL_OIL | Status: DC | PRN
  Administered 2017-05-09: 1 via TOPICAL
  Filled 2017-05-09: qty 120

## 2017-05-09 MED ORDER — PRENATAL 27-0.8 MG PO TABS
1.0000 | ORAL_TABLET | Freq: Every day | ORAL | Status: DC
  Filled 2017-05-09 (×4): qty 1

## 2017-05-09 MED ORDER — MENTHOL 3 MG MT LOZG: 1.0000 | LOZENGE | OROMUCOSAL | Status: DC | PRN

## 2017-05-09 MED ORDER — KETOROLAC TROMETHAMINE 30 MG/ML IJ SOLN
30.0000 mg | Freq: Four times a day (QID) | INTRAMUSCULAR | Status: AC | PRN
  Administered 2017-05-09: 30 mg via INTRAMUSCULAR

## 2017-05-09 MED ORDER — MORPHINE SULFATE (PF) 0.5 MG/ML IJ SOLN
INTRAMUSCULAR | Status: DC | PRN
  Administered 2017-05-09: .2 mg via INTRATHECAL

## 2017-05-09 MED ORDER — SENNOSIDES-DOCUSATE SODIUM 8.6-50 MG PO TABS
2.0000 | ORAL_TABLET | ORAL | Status: DC
  Administered 2017-05-09 – 2017-05-11 (×2): 2 via ORAL
  Filled 2017-05-09 (×2): qty 2

## 2017-05-09 MED ORDER — OXYTOCIN 40 UNITS IN LACTATED RINGERS INFUSION - SIMPLE MED
2.5000 [IU]/h | INTRAVENOUS | Status: AC
Start: 2017-05-09 — End: 2017-05-10

## 2017-05-09 MED ORDER — DIPHENHYDRAMINE HCL 25 MG PO CAPS: 25.0000 mg | ORAL_CAPSULE | Freq: Four times a day (QID) | ORAL | Status: DC | PRN

## 2017-05-09 MED ORDER — DIPHENHYDRAMINE HCL 25 MG PO CAPS
25.0000 mg | ORAL_CAPSULE | ORAL | Status: DC | PRN
  Filled 2017-05-09: qty 1

## 2017-05-09 MED ORDER — OXYTOCIN 10 UNIT/ML IJ SOLN
INTRAVENOUS | Status: DC | PRN
  Administered 2017-05-09: 40 [IU] via INTRAVENOUS

## 2017-05-09 MED ORDER — DIBUCAINE 1 % RE OINT: 1.0000 "application " | TOPICAL_OINTMENT | RECTAL | Status: DC | PRN

## 2017-05-09 MED ORDER — ZOLPIDEM TARTRATE 5 MG PO TABS: 5.0000 mg | ORAL_TABLET | Freq: Every evening | ORAL | Status: DC | PRN

## 2017-05-09 MED ORDER — SIMETHICONE 80 MG PO CHEW: 80.0000 mg | CHEWABLE_TABLET | ORAL | Status: DC | PRN

## 2017-05-09 MED ORDER — METHYLERGONOVINE MALEATE 0.2 MG/ML IJ SOLN: 0.2000 mg | INTRAMUSCULAR | Status: DC | PRN

## 2017-05-09 MED ORDER — ACETAMINOPHEN 325 MG PO TABS
650.0000 mg | ORAL_TABLET | ORAL | Status: DC | PRN
  Administered 2017-05-09 – 2017-05-11 (×4): 650 mg via ORAL
  Filled 2017-05-09 (×4): qty 2

## 2017-05-09 MED ORDER — NALBUPHINE HCL 10 MG/ML IJ SOLN: 5.0000 mg | INTRAMUSCULAR | Status: DC | PRN

## 2017-05-09 MED ORDER — KETOROLAC TROMETHAMINE 30 MG/ML IJ SOLN
INTRAMUSCULAR | Status: AC
  Administered 2017-05-09: 30 mg via INTRAMUSCULAR
  Filled 2017-05-09: qty 1

## 2017-05-09 MED ORDER — KETOROLAC TROMETHAMINE 30 MG/ML IJ SOLN: 30.0000 mg | Freq: Four times a day (QID) | INTRAMUSCULAR | Status: AC | PRN

## 2017-05-09 MED ORDER — NALOXONE HCL 0.4 MG/ML IJ SOLN: 0.4000 mg | INTRAMUSCULAR | Status: DC | PRN

## 2017-05-09 MED ORDER — MORPHINE SULFATE (PF) 0.5 MG/ML IJ SOLN
INTRAMUSCULAR | Status: AC
  Filled 2017-05-09: qty 10

## 2017-05-09 MED ORDER — SIMETHICONE 80 MG PO CHEW
80.0000 mg | CHEWABLE_TABLET | ORAL | Status: DC
  Administered 2017-05-09 – 2017-05-11 (×2): 80 mg via ORAL
  Filled 2017-05-09 (×2): qty 1

## 2017-05-09 MED ORDER — WITCH HAZEL-GLYCERIN EX PADS: 1.0000 "application " | MEDICATED_PAD | CUTANEOUS | Status: DC | PRN

## 2017-05-09 MED ORDER — PHENYLEPHRINE 8 MG IN D5W 100 ML (0.08MG/ML) PREMIX OPTIME
INJECTION | INTRAVENOUS | Status: AC
  Filled 2017-05-09: qty 100

## 2017-05-09 MED ORDER — EPHEDRINE SULFATE-NACL 50-0.9 MG/10ML-% IV SOSY
PREFILLED_SYRINGE | INTRAVENOUS | Status: DC | PRN
  Administered 2017-05-09: 10 mg via INTRAVENOUS

## 2017-05-09 MED ORDER — ONDANSETRON HCL 4 MG/2ML IJ SOLN
INTRAMUSCULAR | Status: AC
  Filled 2017-05-09: qty 2

## 2017-05-09 MED ORDER — OXYCODONE-ACETAMINOPHEN 5-325 MG PO TABS: 2.0000 | ORAL_TABLET | ORAL | Status: DC | PRN

## 2017-05-09 MED ORDER — METHYLERGONOVINE MALEATE 0.2 MG PO TABS: 0.2000 mg | ORAL_TABLET | ORAL | Status: DC | PRN

## 2017-05-09 MED ORDER — BUPIVACAINE IN DEXTROSE 0.75-8.25 % IT SOLN
INTRATHECAL | Status: DC | PRN
  Administered 2017-05-09: 11 mg via INTRATHECAL

## 2017-05-09 MED ORDER — SOD CITRATE-CITRIC ACID 500-334 MG/5ML PO SOLN
30.0000 mL | ORAL | Status: AC
  Administered 2017-05-09: 30 mL via ORAL
  Filled 2017-05-09: qty 15

## 2017-05-09 MED ORDER — OXYTOCIN 10 UNIT/ML IJ SOLN
INTRAMUSCULAR | Status: AC
  Filled 2017-05-09: qty 4

## 2017-05-09 MED ORDER — PRENATAL MULTIVITAMIN CH
1.0000 | ORAL_TABLET | Freq: Every day | ORAL | Status: DC
  Administered 2017-05-10 – 2017-05-11 (×2): 1 via ORAL
  Filled 2017-05-09: qty 1

## 2017-05-09 MED ORDER — NALBUPHINE HCL 10 MG/ML IJ SOLN: 5.0000 mg | Freq: Once | INTRAMUSCULAR | Status: DC | PRN

## 2017-05-09 MED ORDER — LACTATED RINGERS IV SOLN
INTRAVENOUS | Status: DC | PRN
  Administered 2017-05-09: 10:00:00 via INTRAVENOUS

## 2017-05-09 MED ORDER — NALOXONE HCL 4 MG/10ML IJ SOLN
1.0000 ug/kg/h | INTRAVENOUS | Status: DC | PRN
  Filled 2017-05-09: qty 5

## 2017-05-09 MED ORDER — TETANUS-DIPHTH-ACELL PERTUSSIS 5-2.5-18.5 LF-MCG/0.5 IM SUSP
0.5000 mL | Freq: Once | INTRAMUSCULAR | Status: AC
  Administered 2017-05-11: 0.5 mL via INTRAMUSCULAR
  Filled 2017-05-09: qty 0.5

## 2017-05-09 MED ORDER — SODIUM CHLORIDE 0.9% FLUSH: 3.0000 mL | INTRAVENOUS | Status: DC | PRN

## 2017-05-09 MED ORDER — FENTANYL CITRATE (PF) 100 MCG/2ML IJ SOLN
INTRAMUSCULAR | Status: DC | PRN
  Administered 2017-05-09: 25 ug via INTRATHECAL
  Administered 2017-05-09: 50 ug via INTRAVENOUS

## 2017-05-09 MED ORDER — LACTATED RINGERS IV SOLN
INTRAVENOUS | Status: DC
  Administered 2017-05-09 (×2): via INTRAVENOUS

## 2017-05-09 MED ORDER — MEPERIDINE HCL 25 MG/ML IJ SOLN: 6.2500 mg | INTRAMUSCULAR | Status: DC | PRN

## 2017-05-09 MED ORDER — FENTANYL CITRATE (PF) 100 MCG/2ML IJ SOLN
INTRAMUSCULAR | Status: AC
  Filled 2017-05-09: qty 2

## 2017-05-09 MED ORDER — SODIUM CHLORIDE 0.9 % IR SOLN
Status: DC | PRN
  Administered 2017-05-09: 1

## 2017-05-09 MED ORDER — CEFAZOLIN SODIUM-DEXTROSE 2-4 GM/100ML-% IV SOLN
2.0000 g | INTRAVENOUS | Status: AC
  Administered 2017-05-09: 2 g via INTRAVENOUS

## 2017-05-09 MED ORDER — SCOPOLAMINE 1 MG/3DAYS TD PT72: 1.0000 | MEDICATED_PATCH | Freq: Once | TRANSDERMAL | Status: DC

## 2017-05-09 MED ORDER — ONDANSETRON HCL 4 MG/2ML IJ SOLN
INTRAMUSCULAR | Status: DC | PRN
  Administered 2017-05-09: 4 mg via INTRAVENOUS

## 2017-05-09 MED ORDER — LACTATED RINGERS IV SOLN
INTRAVENOUS | Status: DC
  Administered 2017-05-09: 16:00:00 via INTRAVENOUS

## 2017-05-09 MED ORDER — DIPHENHYDRAMINE HCL 50 MG/ML IJ SOLN: 12.5000 mg | INTRAMUSCULAR | Status: DC | PRN

## 2017-05-09 MED ORDER — OXYCODONE-ACETAMINOPHEN 5-325 MG PO TABS: 1.0000 | ORAL_TABLET | ORAL | Status: DC | PRN

## 2017-05-09 MED ORDER — SIMETHICONE 80 MG PO CHEW
80.0000 mg | CHEWABLE_TABLET | Freq: Three times a day (TID) | ORAL | Status: DC
  Administered 2017-05-09 – 2017-05-11 (×7): 80 mg via ORAL
  Filled 2017-05-09 (×8): qty 1

## 2017-05-09 MED ORDER — IBUPROFEN 600 MG PO TABS
600.0000 mg | ORAL_TABLET | Freq: Four times a day (QID) | ORAL | Status: DC
  Administered 2017-05-09 – 2017-05-11 (×9): 600 mg via ORAL
  Filled 2017-05-09 (×9): qty 1

## 2017-05-09 MED ORDER — ONDANSETRON HCL 4 MG/2ML IJ SOLN: 4.0000 mg | Freq: Three times a day (TID) | INTRAMUSCULAR | Status: DC | PRN

## 2017-05-09 SURGICAL SUPPLY — 38 items
BENZOIN TINCTURE PRP APPL 2/3 (GAUZE/BANDAGES/DRESSINGS) ×2 IMPLANT
CHLORAPREP W/TINT 26ML (MISCELLANEOUS) ×4 IMPLANT
CLAMP CORD UMBIL (MISCELLANEOUS) IMPLANT
CLOSURE STERI STRIP 1/2 X4 (GAUZE/BANDAGES/DRESSINGS) ×2 IMPLANT
CLOTH BEACON ORANGE TIMEOUT ST (SAFETY) ×2 IMPLANT
DERMABOND ADVANCED (GAUZE/BANDAGES/DRESSINGS) ×2
DERMABOND ADVANCED .7 DNX12 (GAUZE/BANDAGES/DRESSINGS) ×2 IMPLANT
DRSG OPSITE POSTOP 4X10 (GAUZE/BANDAGES/DRESSINGS) ×2 IMPLANT
ELECT REM PT RETURN 9FT ADLT (ELECTROSURGICAL) ×2
ELECTRODE REM PT RTRN 9FT ADLT (ELECTROSURGICAL) ×1 IMPLANT
EXTRACTOR VACUUM BELL STYLE (SUCTIONS) IMPLANT
GLOVE BIOGEL PI IND STRL 7.0 (GLOVE) ×1 IMPLANT
GLOVE BIOGEL PI IND STRL 8 (GLOVE) ×1 IMPLANT
GLOVE BIOGEL PI INDICATOR 7.0 (GLOVE) ×1
GLOVE BIOGEL PI INDICATOR 8 (GLOVE) ×1
GLOVE ECLIPSE 8.0 STRL XLNG CF (GLOVE) ×2 IMPLANT
GOWN STRL REUS W/TWL LRG LVL3 (GOWN DISPOSABLE) ×4 IMPLANT
KIT ABG SYR 3ML LUER SLIP (SYRINGE) ×2 IMPLANT
NEEDLE HYPO 18GX1.5 BLUNT FILL (NEEDLE) ×2 IMPLANT
NEEDLE HYPO 22GX1.5 SAFETY (NEEDLE) ×2 IMPLANT
NEEDLE HYPO 25X5/8 SAFETYGLIDE (NEEDLE) ×2 IMPLANT
NS IRRIG 1000ML POUR BTL (IV SOLUTION) ×2 IMPLANT
PACK C SECTION WH (CUSTOM PROCEDURE TRAY) ×2 IMPLANT
PAD OB MATERNITY 4.3X12.25 (PERSONAL CARE ITEMS) ×2 IMPLANT
PENCIL SMOKE EVAC W/HOLSTER (ELECTROSURGICAL) ×2 IMPLANT
RTRCTR C-SECT PINK 25CM LRG (MISCELLANEOUS) IMPLANT
SUT CHROMIC 0 CT 1 (SUTURE) ×2 IMPLANT
SUT MNCRL 0 VIOLET CTX 36 (SUTURE) ×2 IMPLANT
SUT MONOCRYL 0 CTX 36 (SUTURE) ×2
SUT PLAIN 2 0 (SUTURE)
SUT PLAIN 2 0 XLH (SUTURE) IMPLANT
SUT PLAIN ABS 2-0 CT1 27XMFL (SUTURE) IMPLANT
SUT VIC AB 0 CTX 36 (SUTURE) ×1
SUT VIC AB 0 CTX36XBRD ANBCTRL (SUTURE) ×1 IMPLANT
SUT VIC AB 4-0 KS 27 (SUTURE) IMPLANT
SYR 20CC LL (SYRINGE) ×4 IMPLANT
TOWEL OR 17X24 6PK STRL BLUE (TOWEL DISPOSABLE) ×2 IMPLANT
TRAY FOLEY BAG SILVER LF 14FR (SET/KITS/TRAYS/PACK) IMPLANT

## 2017-05-09 NOTE — Anesthesia Procedure Notes (Signed)
Spinal  Patient location during procedure: OR Start time: 05/09/2017 9:18 AM End time: 05/09/2017 9:22 AM Staffing Anesthesiologist: Bethena Midgetddono, Ying Blankenhorn, MD Preanesthetic Checklist Completed: patient identified, site marked, surgical consent, pre-op evaluation, timeout performed, IV checked, risks and benefits discussed and monitors and equipment checked Spinal Block Patient position: sitting Prep: DuraPrep Patient monitoring: heart rate, cardiac monitor, continuous pulse ox and blood pressure Approach: midline Location: L3-4 Injection technique: single-shot Needle Needle type: Sprotte  Needle gauge: 24 G Needle length: 9 cm Assessment Sensory level: T4
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
## 2017-05-09 NOTE — Anesthesia Preprocedure Evaluation (Signed)
Anesthesia Evaluation  Patient identified by MRN, date of birth, ID band Patient awake    Reviewed: Allergy & Precautions, H&P , NPO status , Patient's Chart, lab work & pertinent test results  Airway Mallampati: II  TM Distance: >3 FB Neck ROM: full    Dental no notable dental hx.    Pulmonary    Pulmonary exam normal breath sounds clear to auscultation       Cardiovascular Exercise Tolerance: Good negative cardio ROS Normal cardiovascular exam Rhythm:regular Rate:Normal     Neuro/Psych Anxiety negative neurological ROS     GI/Hepatic negative GI ROS, Neg liver ROS,   Endo/Other  negative endocrine ROS  Renal/GU negative Renal ROS  negative genitourinary   Musculoskeletal negative musculoskeletal ROS (+)   Abdominal   Peds negative pediatric ROS (+)  Hematology  (+) anemia ,   Anesthesia Other Findings   Reproductive/Obstetrics negative OB ROS                             Anesthesia Physical  Anesthesia Plan  ASA: II  Anesthesia Plan: Spinal   Post-op Pain Management:    Induction:   PONV Risk Score and Plan:   Airway Management Planned:   Additional Equipment:   Intra-op Plan:   Post-operative Plan:   Informed Consent: I have reviewed the patients History and Physical, chart, labs and discussed the procedure including the risks, benefits and alternatives for the proposed anesthesia with the patient or authorized representative who has indicated his/her understanding and acceptance.   Dental Advisory Given  Plan Discussed with: CRNA, Anesthesiologist and Surgeon  Anesthesia Plan Comments: (Lab work confirmed with CRNA in room. Platelets okay. Discussed spinal anesthetic, and patient consents to the procedure:  included risk of possible headache,backache, failed block, allergic reaction, and nerve injury. This patient was asked if she had any questions or concerns  before the procedure started. )        Anesthesia Quick Evaluation

## 2017-05-09 NOTE — Transfer of Care (Signed)
Immediate Anesthesia Transfer of Care Note  Patient: Julie Zimmerman  Procedure(s) Performed: REPEAT CESAREAN SECTION (N/A )  Patient Location: PACU  Anesthesia Type:Spinal  Level of Consciousness: awake, alert , oriented and patient cooperative  Airway & Oxygen Therapy: Patient Spontanous Breathing  Post-op Assessment: Report given to RN and Post -op Vital signs reviewed and stable  Post vital signs: Reviewed and stable  Last Vitals:  Vitals Value Taken Time  BP 126/75 05/09/2017 10:38 AM  Temp 36.9 C 05/09/2017 10:39 AM  Pulse 77 05/09/2017 10:41 AM  Resp 17 05/09/2017 10:41 AM  SpO2 100 % 05/09/2017 10:41 AM  Vitals shown include unvalidated device data.  Last Pain:  Vitals:   05/09/17 1039  TempSrc: Oral  PainSc:       Patients Stated Pain Goal: 3 (05/09/17 0757)  Complications: No apparent anesthesia complications

## 2017-05-09 NOTE — Anesthesia Postprocedure Evaluation (Signed)
Anesthesia Post Note  Patient: Julie Zimmerman  Procedure(s) Performed: REPEAT CESAREAN SECTION (N/A )     Patient location during evaluation: PACU Anesthesia Type: Spinal Level of consciousness: awake and alert Pain management: pain level controlled Vital Signs Assessment: post-procedure vital signs reviewed and stable Respiratory status: spontaneous breathing and respiratory function stable Cardiovascular status: blood pressure returned to baseline and stable Postop Assessment: spinal receding Anesthetic complications: no    Last Vitals:  Vitals:   05/09/17 1130 05/09/17 1151  BP: 113/89 102/80  Pulse: 65 74  Resp: 16 16  Temp: 36.5 C   SpO2:      Last Pain:  Vitals:   05/09/17 1130  TempSrc: Oral  PainSc:    Pain Goal: Patients Stated Pain Goal: 3 (05/09/17 0757)               Delvonte Berenson

## 2017-05-09 NOTE — Op Note (Signed)
Cesarean Section Procedure Note   Julie Haroldllison Silber  05/09/2017  Indications: Scheduled Proceedure/Maternal Request   Pre-operative Diagnosis: repeat cesarean section.   Post-operative Diagnosis: Same   Surgeon: Surgeon(s) and Role:    * Eure, Amaryllis DykeLuther H, MD - Primary    * Pincus LargePhelps, Cerra Eisenhower Y, DO - Fellow   Attending Attestation: I was present and scrubbed for the key portions of the procedure.   Assistants: none  Anesthesia: spinal    Estimated Blood Loss: 516 mL   Total IV Fluids: 1800ml  Urine Output: 350CC OF clear urine  Specimens: None  Findings: None  Baby condition / location:  Couplet care / Skin to Skin, APGAR: 8, 9; weight 3900g (8lb 9.6oz).    Complications: Vaccum extractor used to assist with delivery. Loose nuchal x1.   Indications: Julie Zimmerman is a 22 y.o. G2P1001 with an IUP 681w0d presenting for elective repeat c-section.  The risks, benefits, complications, treatment options, and expected outcomes were discussed with the patient . The patient concurred with the proposed plan, giving informed consent. identified as Julie Zimmerman and the procedure verified as C-Section Delivery.  Procedure Details: A Time Out was held and the above information confirmed.  The patient was taken back to the operative suite where spinal anesthesia was placed.  After induction of anesthesia, the patient was draped and prepped in the usual sterile manner and placed in a dorsal supine position with a leftward tilt. A transverse was made and carried down through the subcutaneous tissue to the fascia. Fascial incision was made and extended transversely. The fascia was separated from the underlying rectus tissue superiorly and inferiorly. The peritoneum was identified and entered. Peritoneal incision was extended longitudinally. A low transverse uterine incision was made. Delivered from cephalic presentation was a 753900gram Female with Apgar scores of 8 at one minute and 9 at five  minutes. Cord ph was sent the umbilical cord was clamped and cut cord blood was obtained for evaluation. The placenta was removed Intact and appeared normal. The uterine outline, tubes and ovaries appeared normal}. The uterine incision was closed with running locked sutures of 0 Monocryl.   Hemostasis was observed. Lavage was carried out until clear. The fascia was then reapproximated with running sutures of 0Vicryl. The skin was closed with 4-0Vicryl.   Instrument, sponge, and needle counts were correct prior the abdominal closure and were correct at the conclusion of the case.   Disposition: PACU - hemodynamically stable.   Maternal Condition: stable   Signed: Caryl AdaJazma Zannie Runkle, DO OB Fellow Center for [facility], [facility]

## 2017-05-10 LAB — CBC
HEMATOCRIT: 27 % — AB (ref 36.0–46.0)
HEMOGLOBIN: 8.6 g/dL — AB (ref 12.0–15.0)
MCH: 26.1 pg (ref 26.0–34.0)
MCHC: 31.9 g/dL (ref 30.0–36.0)
MCV: 82.1 fL (ref 78.0–100.0)
PLATELETS: 215 10*3/uL (ref 150–400)
RBC: 3.29 MIL/uL — AB (ref 3.87–5.11)
RDW: 15.7 % — ABNORMAL HIGH (ref 11.5–15.5)
WBC: 12.3 10*3/uL — AB (ref 4.0–10.5)

## 2017-05-10 LAB — BIRTH TISSUE RECOVERY COLLECTION (PLACENTA DONATION)

## 2017-05-10 NOTE — Lactation Note (Signed)
This note was copied from a baby's chart. Lactation Consultation Note  Patient Name: Julie Zimmerman ZOXWR'UToday's Date: 05/10/2017 Reason for consult: Follow-up assessment  Initial visit at 30 hours of life. Mom is a P2 who nursed her 1st child for 1 year. Mom's anatomy suggests that she has an abundant supply once her milk has come to volume. Mom agrees.   Second visit when Mom called for latch check. Infant latches with ease. Frequency of swallows improves with breast compression. Mom encouraged to do so (DAT+).   Julie Zimmerman, Julie Zimmerman [facility] 05/10/2017, 5:14 PM

## 2017-05-10 NOTE — Addendum Note (Signed)
Addendum  created 05/10/17 [id number] by Algis GreenhouseBurger, Seville Downs A, CRNA   Sign clinical note
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
## 2017-05-10 NOTE — Anesthesia Postprocedure Evaluation (Signed)
Anesthesia Post Note  Patient: Julie Zimmerman  Procedure(s) Performed: REPEAT CESAREAN SECTION (N/A )     Patient location during evaluation: Mother Baby Anesthesia Type: Spinal Level of consciousness: awake Pain management: satisfactory to patient Vital Signs Assessment: post-procedure vital signs reviewed and stable Respiratory status: spontaneous breathing Cardiovascular status: stable Anesthetic complications: no    Last Vitals:  Vitals:   05/10/17 0100 05/10/17 0400  BP:  (!) 113/52  Pulse:  69  Resp:  18  Temp:  36.7 C  SpO2: 96% 98%    Last Pain:  Vitals:   05/10/17 0600  TempSrc:   PainSc: 3    Pain Goal: Patients Stated Pain Goal: 2 (05/09/17 1901)               Cephus ShellingBURGER,Tarique Loveall

## 2017-05-10 NOTE — Progress Notes (Signed)
Subjective: Postpartum Day 1: Cesarean Delivery Patient reports incisional pain and tolerating PO.    Objective: Vital signs in last 24 hours: Temp:  [97 F (36.1 C)-98.5 F (36.9 C)] 98 F (36.7 C) (04/04 0400) Pulse Rate:  [58-87] 69 (04/04 0400) Resp:  [6-21] 18 (04/04 0400) BP: (102-127)/(52-92) 113/52 (04/04 0400) SpO2:  [96 %-100 %] 98 % (04/04 0400)  Physical Exam:  General: alert, cooperative and no distress Lochia: appropriate Uterine Fundus: firm Incision: healing well, no significant drainage DVT Evaluation: No evidence of DVT seen on physical exam.  Recent Labs    05/08/17 1040 05/10/17 0527  HGB 9.5* 8.6*  HCT 30.2* 27.0*    Assessment/Plan: Status post Cesarean section. Doing well postoperatively.  Continue current care.  Julie Zimmerman 05/10/2017, 7:23 AM

## 2017-05-10 NOTE — Progress Notes (Signed)
Patient stated bruising on L forearm from IV attempt was getting worse. Denies any pain unless she presses it. Patient is able to move arm without any issues.  Ice applied. Will continue to monitor.

## 2017-05-11 MED ORDER — IBUPROFEN 600 MG PO TABS: 600.0000 mg | ORAL_TABLET | Freq: Four times a day (QID) | ORAL | 0 refills | Status: DC

## 2017-05-11 MED ORDER — TRAMADOL HCL 50 MG PO TABS: 50.0000 mg | ORAL_TABLET | Freq: Four times a day (QID) | ORAL | 0 refills | Status: AC | PRN

## 2017-05-11 MED ORDER — MEASLES, MUMPS & RUBELLA VAC ~~LOC~~ INJ
0.5000 mL | INJECTION | Freq: Once | SUBCUTANEOUS | Status: AC
  Administered 2017-05-11: 0.5 mL via SUBCUTANEOUS
  Filled 2017-05-11 (×2): qty 0.5

## 2017-05-11 NOTE — Plan of Care (Signed)
Progressing appropriately. Encouraged to call for assistance as needed with feeding and for LATCH assessment. 

## 2017-05-11 NOTE — Lactation Note (Signed)
This note was copied from a baby's chart. Lactation Consultation Note  Patient Name: Julie Zimmerman WFUXN'AToday's Date: 05/11/2017 Reason for consult: Follow-up assessment   P2, Ex BF.  Baby 53 hours old. Mother's breasts are filling.  Provided ice packs and engorgement care. Observed feeding with intermittent swallows. Mom encouraged to feed baby 8-12 times/24 hours and with feeding cues.     Maternal Data Has patient been taught Hand Expression?: Yes  Feeding Feeding Type: Breast Fed Length of feed: 10 min  LATCH Score Latch: Grasps breast easily, tongue down, lips flanged, rhythmical sucking.  Audible Swallowing: Spontaneous and intermittent  Type of Nipple: Everted at rest and after stimulation  Comfort (Breast/Nipple): Filling, red/small blisters or bruises, mild/mod discomfort  Hold (Positioning): Assistance needed to correctly position infant at breast and maintain latch.  LATCH Score: 8  Interventions    Lactation Tools Discussed/Used     Consult Status Consult Status: Complete    Hardie PulleyBerkelhammer, Ruth Boschen 05/11/2017, 3:00 PM

## 2017-05-11 NOTE — Discharge Summary (Signed)
OB Discharge Summary     Patient Name: Julie Zimmerman DOB: October 13, 1995 MRN: [id number]  Date of admission: 05/09/2017 Delivering MD: Tania Ade H   Date of discharge: 05/11/2017  Admitting diagnosis: REPEAT C-SECTION Intrauterine pregnancy: [redacted]w[redacted]d    Secondary diagnosis:  Active Problems:   Supervision of normal pregnancy   Rubella non-immune status, antepartum   Anemia of pregnancy in third trimester   S/P cesarean section  Additional problems: None     Discharge diagnosis: Term Pregnancy Delivered                                                                                                Post partum procedures:None  Augmentation: N/A  Complications: None  Hospital course:  Sceduled C/S   22y.o. yo G2P2002 at 324w0das admitted to the hospital 05/09/2017 for scheduled cesarean section with the following indication:Elective Repeat.  Membrane Rupture Time/Date: 9:49 AM ,05/09/2017   Patient delivered a Viable infant.05/09/2017  Details of operation can be found in separate operative note.  Pateint had an uncomplicated postpartum course.  She is ambulating, tolerating a regular diet, passing flatus, and urinating well. Patient is discharged home in stable condition on  05/11/17         Physical exam  Vitals:   05/10/17 0400 05/10/17 0900 05/10/17 1904 05/11/17 0558  BP: (!) 113/52 123/76 127/74 116/71  Pulse: 69 79 64 72  Resp: _0 Temp: 98 F (36.7 C)  98.4 F (36.9 C) 98.2 F (36.8 C)  TempSrc: Oral  Oral Oral  SpO2: 98%     Weight:      Height:       General: alert, cooperative and no distress Lochia: appropriate Uterine Fundus: firm Incision: Dressing is clean, dry, and intact DVT Evaluation: No evidence of DVT seen on physical exam. Labs: Lab Results  Component Value Date   WBC 12.3 (H) 05/10/2017   HGB 8.6 (L) 05/10/2017   HCT 27.0 (L) 05/10/2017   MCV 82.1 05/10/2017   PLT 215 05/10/2017   CMP Latest Ref Rng & Units 05/05/2017  Glucose 65 -  99 mg/dL 113(H)  BUN 6 - 20 mg/dL 7  Creatinine 0.44 - 1.00 mg/dL 0.52  Sodium 135 - 145 mmol/L 135  Potassium 3.5 - 5.1 mmol/L 3.4(L)  Chloride 101 - 111 mmol/L 106  CO2 22 - 32 mmol/L 21(L)  Calcium 8.9 - 10.3 mg/dL 8.8(L)  Total Protein 6.5 - 8.1 g/dL 5.8(L)  Total Bilirubin 0.3 - 1.2 mg/dL 0.3  Alkaline Phos 38 - 126 U/L 176(H)  AST 15 - 41 U/L 19  ALT 14 - 54 U/L 9(L)    Discharge instruction: per After Visit Summary and "Baby and Me Booklet".  After visit meds:  Allergies as of 05/11/2017      Reactions   Latex Swelling   Scopolamine Other (See Comments)   HALLUCINATIONS.      Medication List    STOP taking these medications   multivitamin-prenatal 27-0.8 MG Tabs tablet   omeprazole 20 MG capsule Commonly known as:  PRILOSEC  TAKE these medications   ibuprofen 600 MG tablet Commonly known as:  ADVIL,MOTRIN Take 1 tablet (600 mg total) by mouth every 6 (six) hours.   traMADol 50 MG tablet Commonly known as:  ULTRAM Take 1 tablet (50 mg total) by mouth every 6 (six) hours as needed for up to 7 days.      Stool softener prn  Diet: routine diet  Activity: Advance as tolerated. Pelvic rest for 6 weeks.   Outpatient follow up:1 week for incision check, 4 weeks for post partum Follow up Appt: Future Appointments  Date Time Provider [place]  05/17/2017 10:30 AM Florian Buff, MD [facility]   Follow up Visit:No follow-ups on file.  Postpartum contraception: Considering copper IUD  Newborn Data: Live born female  Birth Weight: 8 lb 9.6 oz (3900 g) APGAR: 8, 9  Newborn Delivery   Birth date/time:  05/09/2017 09:51:00 Delivery type:  C-Section, Vacuum Assisted C-section categorization:  Repeat     Baby Feeding: Breast Disposition:home with mother   I have spoken with and examined this patient and agree with resident/PA-S/Med-S/SNM's note and plan of care. VSS, HRR&R, Resp unlabored, incision C/D/I;  Legs neg.  Nigel Berthold, CNM 05/11/2017 8:01 AM      05/11/2017 Christin Fudge, CNM

## 2017-05-11 NOTE — Discharge Summary (Signed)
OB Discharge Summary     Patient Name: Julie Zimmerman DOB: October 13, 1995 MRN: [id number]  Date of admission: 05/09/2017 Delivering MD: Tania Ade H   Date of discharge: 05/11/2017  Admitting diagnosis: REPEAT C-SECTION Intrauterine pregnancy: [redacted]w[redacted]d    Secondary diagnosis:  Active Problems:   Supervision of normal pregnancy   Rubella non-immune status, antepartum   Anemia of pregnancy in third trimester   S/P cesarean section  Additional problems: None     Discharge diagnosis: Term Pregnancy Delivered                                                                                                Post partum procedures:None  Augmentation: N/A  Complications: None  Hospital course:  Sceduled C/S   22y.o. yo G2P2002 at 324w0das admitted to the hospital 05/09/2017 for scheduled cesarean section with the following indication:Elective Repeat.  Membrane Rupture Time/Date: 9:49 AM ,05/09/2017   Patient delivered a Viable infant.05/09/2017  Details of operation can be found in separate operative note.  Pateint had an uncomplicated postpartum course.  She is ambulating, tolerating a regular diet, passing flatus, and urinating well. Patient is discharged home in stable condition on  05/11/17         Physical exam  Vitals:   05/10/17 0400 05/10/17 0900 05/10/17 1904 05/11/17 0558  BP: (!) 113/52 123/76 127/74 116/71  Pulse: 69 79 64 72  Resp: _0 Temp: 98 F (36.7 C)  98.4 F (36.9 C) 98.2 F (36.8 C)  TempSrc: Oral  Oral Oral  SpO2: 98%     Weight:      Height:       General: alert, cooperative and no distress Lochia: appropriate Uterine Fundus: firm Incision: Dressing is clean, dry, and intact DVT Evaluation: No evidence of DVT seen on physical exam. Labs: Lab Results  Component Value Date   WBC 12.3 (H) 05/10/2017   HGB 8.6 (L) 05/10/2017   HCT 27.0 (L) 05/10/2017   MCV 82.1 05/10/2017   PLT 215 05/10/2017   CMP Latest Ref Rng & Units 05/05/2017  Glucose 65 -  99 mg/dL 113(H)  BUN 6 - 20 mg/dL 7  Creatinine 0.44 - 1.00 mg/dL 0.52  Sodium 135 - 145 mmol/L 135  Potassium 3.5 - 5.1 mmol/L 3.4(L)  Chloride 101 - 111 mmol/L 106  CO2 22 - 32 mmol/L 21(L)  Calcium 8.9 - 10.3 mg/dL 8.8(L)  Total Protein 6.5 - 8.1 g/dL 5.8(L)  Total Bilirubin 0.3 - 1.2 mg/dL 0.3  Alkaline Phos 38 - 126 U/L 176(H)  AST 15 - 41 U/L 19  ALT 14 - 54 U/L 9(L)    Discharge instruction: per After Visit Summary and "Baby and Me Booklet".  After visit meds:  Allergies as of 05/11/2017      Reactions   Latex Swelling   Scopolamine Other (See Comments)   HALLUCINATIONS.      Medication List    STOP taking these medications   multivitamin-prenatal 27-0.8 MG Tabs tablet   omeprazole 20 MG capsule Commonly known as:  PRILOSEC  TAKE these medications   ibuprofen 600 MG tablet Commonly known as:  ADVIL,MOTRIN Take 1 tablet (600 mg total) by mouth every 6 (six) hours.   traMADol 50 MG tablet Commonly known as:  ULTRAM Take 1 tablet (50 mg total) by mouth every 6 (six) hours as needed for up to 7 days.       Diet: routine diet  Activity: Advance as tolerated. Pelvic rest for 6 weeks.   Outpatient follow up:1 week for incision check, 4 weeks for post partum Follow up Appt: Future Appointments  Date Time Provider Department [facility]  05/17/2017 10:30 AM Lazaro ArmsEure, Luther H, MD [facility]   Follow up Visit:No follow-ups on file.  Postpartum contraception: Considering copper IUD  Newborn Data: Live born female  Birth Weight: 8 lb 9.6 oz (3900 g) APGAR: 8, 9  Newborn Delivery   Birth date/time:  05/09/2017 09:51:00 Delivery type:  C-Section, Vacuum Assisted C-section categorization:  Repeat     Baby Feeding: Breast Disposition:home with mother   05/11/2017 Felicie MornHannah E Shakya Sebring, Medical Student

## 2017-05-11 NOTE — Progress Notes (Signed)
CSW received consult for hx of Anxiety and Depression.  CSW met with MOB to offer support and complete assessment.    When CSW arrived, MOB was bonding with baby as evidence by engaging in skin to skin.  FOB was also present and was observing MOB's and infant's interaction. CSW explained CSW's role and MOB gave CSW permission to complete the assessment while FOB was present. The couple of was supportive of one another during the assessment and both communicated having a good support team. They were easy to engage and receptive to meeting with CSW.   CSW asked about MOB's MH hx and MOB openly shared a hx of anxiety and panic attacks since elementary school.  MOB communicated MOB's most recent attack was during MOB's first trimester; MOB denied having any additional signs or symptoms since her first trimester. MOB shared that MOB manages her symptoms by engaging in deep breathing and relaxations activities.   CSW provided education regarding the baby blues period vs. perinatal mood disorders, discussed treatment and gave resources for mental health follow up if concerns arise.  CSW recommends self-evaluation during the postpartum time period using the New Mom Checklist from Postpartum Progress and encouraged MOB to contact a medical professional if symptoms are noted at any time.  MOB did not present with any acute MH signs or symptoms and denied SI and HI. CSW offered outpatient counseling resources and MOB declined.  CSW identifies no further need for intervention and no barriers to discharge at this time.  Jovonni Borquez Boyd-Gilyard, MSW, LCSW Clinical Social Work [phone]   

## 2017-05-17 ENCOUNTER — Encounter: Payer: Self-pay | Admitting: Obstetrics & Gynecology

## 2017-05-17 ENCOUNTER — Ambulatory Visit (INDEPENDENT_AMBULATORY_CARE_PROVIDER_SITE_OTHER): Payer: 59 | Admitting: Obstetrics & Gynecology

## 2017-05-17 ENCOUNTER — Other Ambulatory Visit: Payer: Self-pay

## 2017-05-17 VITALS — BP 126/84 | HR 83 | Ht 66.0 in | Wt 146.0 lb

## 2017-05-17 DIAGNOSIS — Z9889 Other specified postprocedural states: Secondary | ICD-10-CM

## 2017-05-17 DIAGNOSIS — Z98891 History of uterine scar from previous surgery: Secondary | ICD-10-CM

## 2017-05-17 NOTE — Progress Notes (Signed)
  HPI: Patient returns for routine postoperative follow-up having undergone repeat C section on 05/09/2017.  The patient's immediate postoperative recovery has been unremarkable. Since hospital discharge the patient reports no problems.   Current Outpatient Medications: ibuprofen (ADVIL,MOTRIN) 600 MG tablet, Take 1 tablet (600 mg total) by mouth every 6 (six) hours., Disp: 30 tablet, Rfl: 0 traMADol (ULTRAM) 50 MG tablet, Take 1 tablet (50 mg total) by mouth every 6 (six) hours as needed for up to 7 days. (Patient not taking: Reported on 05/17/2017), Disp: 28 tablet, Rfl: 0  No current facility-administered medications for this visit.     Blood pressure 126/84, pulse 83, height 5\' 6"  (1.676 m), weight 146 lb (66.2 kg), currently breastfeeding.  Physical Exam: Incision clean dry intact Abdomen is soft non tender  Diagnostic Tests:   Pathology:   Impression: S/p repeat Caesarean section  Plan:   Follow up: 4  weeks  Lazaro ArmsLuther H Eure, MD

## 2017-06-21 ENCOUNTER — Encounter: Payer: Self-pay | Admitting: Obstetrics & Gynecology

## 2017-06-21 ENCOUNTER — Ambulatory Visit (INDEPENDENT_AMBULATORY_CARE_PROVIDER_SITE_OTHER): Payer: 59 | Admitting: Obstetrics & Gynecology

## 2017-06-21 MED ORDER — MISOPROSTOL 200 MCG PO TABS: ORAL_TABLET | ORAL | 0 refills | Status: DC

## 2017-06-21 NOTE — Progress Notes (Signed)
Subjective:     Julie Zimmerman is a 22 y.o. female who presents for a postpartum visit. She is 5 weeks postpartum following a low cervical transverse Cesarean section. I have fully reviewed the prenatal and intrapartum course. The delivery was at 39 gestational weeks. Outcome: repeat cesarean section, low transverse incision. Anesthesia: spinal. Postpartum course has been normal. Baby's course has been unremarkable. Baby is feeding by bottle. Bleeding no bleeding. Bowel function is normal. Bladder function is normal. Patient is not sexually active. Contraception method is none. Postpartum depression screening: negative.  The following portions of the patient's history were reviewed and updated as appropriate: allergies, current medications, past family history, past medical history, past social history, past surgical history and problem list.  Review of Systems Pertinent items are noted in HPI.   Objective:    BP 112/64 (BP Location: Left Arm, Patient Position: Sitting, Cuff Size: Normal)   Pulse 90   Ht  (1.676 m)   Wt 142 lb (64.4 kg)   Breastfeeding? Yes   BMI 22.92 kg/m   General:  alert, cooperative and no distress   Breasts:  inspection negative, no nipple discharge or bleeding, no masses or nodularity palpable  Lungs:   Heart:    Abdomen: normal post op visit   Vulva:  normal  Vagina: normal vagina  Cervix:  no cervical motion tenderness and no lesions  Corpus: normal size, contour, position, consistency, mobility, non-tender  Adnexa:  normal adnexa  Rectal Exam: Not performed.        Assessment:     normal postpartum exam. Pap smear not done at today's visit.   Plan:    1. Contraception: IUD will schedule to come back in 2.  3. Follow up in: 2 weeks or as needed.

## 2017-07-05 ENCOUNTER — Encounter: Payer: Self-pay | Admitting: Advanced Practice Midwife

## 2017-07-05 ENCOUNTER — Ambulatory Visit (INDEPENDENT_AMBULATORY_CARE_PROVIDER_SITE_OTHER): Payer: 59 | Admitting: Advanced Practice Midwife

## 2017-07-05 ENCOUNTER — Other Ambulatory Visit: Payer: Self-pay

## 2017-07-05 VITALS — BP 116/78 | HR 95 | Ht 66.0 in | Wt 142.0 lb

## 2017-07-05 DIAGNOSIS — Z30430 Encounter for insertion of intrauterine contraceptive device: Secondary | ICD-10-CM

## 2017-07-05 DIAGNOSIS — Z3202 Encounter for pregnancy test, result negative: Secondary | ICD-10-CM | POA: Diagnosis not present

## 2017-07-05 LAB — POCT URINE PREGNANCY: PREG TEST UR: NEGATIVE

## 2017-07-05 MED ORDER — LEVONORGESTREL 20 MCG/24HR IU IUD
INTRAUTERINE_SYSTEM | Freq: Once | INTRAUTERINE | Status: AC
  Administered 2017-07-05: 10:00:00 via INTRAUTERINE

## 2017-07-05 NOTE — Addendum Note (Signed)
Addended by: Tish Frederickson A on: 07/05/2017 10:03 AM   Modules accepted: Orders

## 2017-07-05 NOTE — Progress Notes (Signed)
Julie Zimmerman is a 22 y.o. year old  female   who presents for placement of a mIRENA IUD. She hasn't been sexually active since deliery and her pregnancy test today is negative.    The risks and benefits of the method and placement have been thouroughly reviewed with the patient and all questions were answered.  Specifically the patient is aware of failure rate of 02/998, expulsion of the IUD and of possible perforation.  The patient is aware of irregular bleeding due to the method and understands the incidence of irregular bleeding diminishes with time.  Time out was performed.  A Graves speculum was placed.  The cervix was prepped using Betadine. The uterus was found to be tilted towards the right and it sounded to 7 cm.  The cervix was grasped with a tenaculum and the IUD was inserted to 7 cm.  It was pulled back 1 cm and the IUD was disengaged.  The strings were trimmed to 3 cm.  Sonogram was performed and the proper placement of the IUD was verified.  The patient was instructed on signs and symptoms of infection and to check for the strings after each menses or each month.  The patient is to refrain from intercourse for 3 days.  The patient is scheduled for a return appointment after her first menses or 4 weeks.  Jacklyn Shell 07/05/2017 9:35 AM

## 2017-08-02 ENCOUNTER — Ambulatory Visit: Payer: 59 | Admitting: Women's Health

## 2017-08-06 ENCOUNTER — Encounter: Payer: Self-pay | Admitting: Women's Health

## 2017-08-06 ENCOUNTER — Ambulatory Visit (INDEPENDENT_AMBULATORY_CARE_PROVIDER_SITE_OTHER): Payer: 59 | Admitting: Women's Health

## 2017-08-06 VITALS — BP 122/79 | HR 68 | Ht 66.0 in | Wt 138.6 lb

## 2017-08-06 DIAGNOSIS — N898 Other specified noninflammatory disorders of vagina: Secondary | ICD-10-CM | POA: Diagnosis not present

## 2017-08-06 DIAGNOSIS — B9689 Other specified bacterial agents as the cause of diseases classified elsewhere: Secondary | ICD-10-CM | POA: Diagnosis not present

## 2017-08-06 DIAGNOSIS — N760 Acute vaginitis: Secondary | ICD-10-CM | POA: Diagnosis not present

## 2017-08-06 DIAGNOSIS — Z30431 Encounter for routine checking of intrauterine contraceptive device: Secondary | ICD-10-CM

## 2017-08-06 LAB — POCT WET PREP (WET MOUNT)
CLUE CELLS WET PREP WHIFF POC: POSITIVE
Trichomonas Wet Prep HPF POC: ABSENT

## 2017-08-06 MED ORDER — METRONIDAZOLE 0.75 % VA GEL: 1.0000 | Freq: Every day | VAGINAL | 0 refills | Status: AC

## 2017-08-06 NOTE — Progress Notes (Signed)
   GYN VISIT Patient name: Julie Zimmerman MRN [id number]  Date of birth: [birth year] Chief Complaint:   Follow-up  History of Present Illness:   Julie Haroldllison [place] is a 22 y.o. 722P2002 Caucasian female being seen today for IUD check. Mirena IUD inserted 07/05/17 by [facility].  Irregular bleeding, thinks it's starting to slow. Some pain w/ sex, but that has been since after c/s w/ 1st child. Denies abnormal discharge, itching/odor/irritation.  Has not checked strings.   No LMP recorded. (Menstrual status: IUD). The current method of family planning is IUD. Last pap 05/03/15. Results were:  normal Review of Systems:   Pertinent items are noted in HPI Denies fever/chills, dizziness, headaches, visual disturbances, fatigue, shortness of breath, chest pain, abdominal pain, vomiting, abnormal vaginal discharge/itching/odor/irritation, problems with periods, bowel movements, urination, or intercourse unless otherwise stated above.  Pertinent History Reviewed:  Reviewed past medical,surgical, social, obstetrical and family history.  Reviewed problem list, medications and allergies. Physical Assessment:   Vitals:   08/06/17 1147  BP: 122/79  Pulse: 68  Weight: 138 lb 9.6 oz (62.9 kg)  Height: 5\' 6"  (1.676 m)  Body mass index is 22.37 kg/m.       Physical Examination:   General appearance: alert, well appearing, and in no distress  Mental status: alert, oriented to person, place, and time  Skin: warm & dry   Cardiovascular: normal heart rate noted  Respiratory: normal respiratory effort, no distress  Abdomen: soft, non-tender   Pelvic: VULVA: normal appearing vulva with no masses, tenderness or lesions, VAGINA: normal appearing vagina with normal color and small amt maroon colored slightly malodorous discharge, no lesions, CERVIX: normal appearing cervix without discharge or lesions, IUD strings visible  Extremities: no edema   Results for orders placed or performed in visit on 08/06/17 (from the  past 24 hour(s))  POCT Wet Prep Mellody Drown([place])   Collection Time: 08/06/17 12:22 PM  Result Value Ref Range   Source Wet Prep POC vaginal    WBC, Wet Prep HPF POC few    Bacteria Wet Prep HPF POC Few Few   BACTERIA WET PREP MORPHOLOGY POC     Clue Cells Wet Prep HPF POC Few (A) None   Clue Cells Wet Prep Whiff POC Positive Whiff    Yeast Wet Prep HPF POC None None   KOH Wet Prep POC  None   Trichomonas Wet Prep HPF POC Absent Absent    Assessment & Plan:  1) Mirena IUD check> in place  2) Mild BV> rx metrogel, no sex/etoh while using  3) Dyspareunia> see if improves after metrogel  Meds:  Meds ordered this encounter  Medications  . metroNIDAZOLE (METROGEL VAGINAL) 0.75 % vaginal gel    Sig: Place 1 Applicatorful vaginally at bedtime. X 5 nights, no alcohol or sex while using    Dispense:  70 g    Refill:  0    Order Specific Question:   Supervising Provider    Answer:   Duane LopeEURE, LUTHER H [[id number]]    Orders Placed This Encounter  Procedures  . POCT Wet Prep [facility]([place])    Return for March for , Pap & physical.  Cheral MarkerKimberly R Laycee Fitzsimmons CNM, [facility]NP-BC 08/06/2017 12:23 PM

## 2018-10-28 ENCOUNTER — Ambulatory Visit (INDEPENDENT_AMBULATORY_CARE_PROVIDER_SITE_OTHER): Payer: 59 | Admitting: Obstetrics & Gynecology

## 2018-10-28 ENCOUNTER — Other Ambulatory Visit: Payer: Self-pay

## 2018-10-28 ENCOUNTER — Encounter: Payer: Self-pay | Admitting: Obstetrics & Gynecology

## 2018-10-28 VITALS — BP 121/79 | HR 68 | Ht 66.0 in | Wt 148.0 lb

## 2018-10-28 DIAGNOSIS — Z30431 Encounter for routine checking of intrauterine contraceptive device: Secondary | ICD-10-CM
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
## 2019-10-10 ENCOUNTER — Ambulatory Visit (INDEPENDENT_AMBULATORY_CARE_PROVIDER_SITE_OTHER): Payer: 59 | Admitting: Obstetrics & Gynecology

## 2019-10-10 ENCOUNTER — Encounter: Payer: Self-pay | Admitting: Obstetrics & Gynecology

## 2019-10-10 ENCOUNTER — Other Ambulatory Visit: Payer: Self-pay

## 2019-10-10 VITALS — BP 122/80 | HR 80 | Ht 66.0 in | Wt 154.8 lb

## 2019-10-10 DIAGNOSIS — N938 Other specified abnormal uterine and vaginal bleeding: Secondary | ICD-10-CM

## 2019-10-10 MED ORDER — MEGESTROL ACETATE 40 MG PO TABS: ORAL_TABLET | ORAL | 0 refills | Status: AC
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
## 2019-10-28 DIAGNOSIS — R002 Palpitations: Secondary | ICD-10-CM

## 2019-10-28 HISTORY — DX: Palpitations: R00.2

## 2020-09-22 ENCOUNTER — Other Ambulatory Visit: Payer: Self-pay

## 2020-09-22 ENCOUNTER — Encounter (HOSPITAL_COMMUNITY): Payer: Self-pay

## 2020-09-22 ENCOUNTER — Emergency Department (HOSPITAL_COMMUNITY)
Admission: EM | Admit: 2020-09-22 | Discharge: 2020-09-22 | Disposition: A | Discharge status: Home / Self Care | Payer: 59 | Attending: Emergency Medicine | Admitting: Emergency Medicine

## 2020-09-22 ENCOUNTER — Emergency Department (HOSPITAL_COMMUNITY): Payer: 59

## 2020-09-22 DIAGNOSIS — M25571 Pain in right ankle and joints of right foot: Secondary | ICD-10-CM | POA: Diagnosis not present

## 2020-09-22 DIAGNOSIS — S93401A Sprain of unspecified ligament of right ankle, initial encounter: Secondary | ICD-10-CM

## 2020-09-22 DIAGNOSIS — R55 Syncope and collapse: Secondary | ICD-10-CM | POA: Diagnosis not present

## 2020-09-22 DIAGNOSIS — R519 Headache, unspecified: Secondary | ICD-10-CM | POA: Diagnosis present

## 2020-09-22 DIAGNOSIS — J45909 Unspecified asthma, uncomplicated: Secondary | ICD-10-CM | POA: Insufficient documentation

## 2020-09-22 DIAGNOSIS — S060X9A Concussion with loss of consciousness of unspecified duration, initial encounter: Secondary | ICD-10-CM

## 2020-09-22 DIAGNOSIS — Z91040 Latex allergy status: Secondary | ICD-10-CM | POA: Diagnosis not present

## 2020-09-22 DIAGNOSIS — F0781 Postconcussional syndrome: Secondary | ICD-10-CM | POA: Insufficient documentation

## 2020-09-22 NOTE — Progress Notes (Signed)
Orthopedic Tech Progress Note Patient Details:  Julie Zimmerman [birth year] [id number]  Ortho Devices Type of Ortho Device: ASO, Crutches Ortho Device/Splint Location: Right ankle Ortho Device/Splint Interventions: Application   Post Interventions Patient Tolerated: Well  Julie Zimmerman 09/22/2020, 7:55 PM

## 2020-09-22 NOTE — ED Notes (Signed)
Family at bedside and updated as to patient's status. 

## 2020-09-22 NOTE — ED Notes (Signed)
Called ortho tech at this time for need of ASO lace up ankle brace.

## 2020-09-22 NOTE — ED Notes (Signed)
Patient transported to CT 
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
## 2020-09-22 NOTE — ED Notes (Signed)
EDP Freida Busman Provider at bedside.

## 2020-11-19 ENCOUNTER — Telehealth: Payer: Self-pay | Admitting: Adult Health

## 2020-11-19 NOTE — Telephone Encounter (Signed)
Pt has history of recent concussion  States she left a tampon in for what she thinks was about 3 weeks  She was able to get it out, states it was flat No fever, no pain, no odor, no discharge Urinating fine States she feels fine just very embarrassed & wonders if she's gonna be ok, would like to speak to a nurse  Please advise & notify pt    [facility]

## 2020-11-19 NOTE — Telephone Encounter (Signed)
Returned pt's call. Two identifiers used. Pt's concerns addressed. Discussed with Julie Zimmerman and told the pt to monitor herself. If she develops any symptoms to call the office. Pt confirmed understanding.

## 2021-09-05 ENCOUNTER — Ambulatory Visit (INDEPENDENT_AMBULATORY_CARE_PROVIDER_SITE_OTHER): Payer: 59 | Admitting: Obstetrics & Gynecology

## 2021-09-05 ENCOUNTER — Other Ambulatory Visit (HOSPITAL_COMMUNITY)
Admission: RE | Admit: 2021-09-05 | Discharge: 2021-09-05 | Disposition: A | Discharge status: Home / Self Care | Payer: 59 | Source: Ambulatory Visit | Attending: Obstetrics & Gynecology | Admitting: Obstetrics & Gynecology

## 2021-09-05 ENCOUNTER — Encounter: Payer: Self-pay | Admitting: Obstetrics & Gynecology

## 2021-09-05 VITALS — BP 128/82 | HR 72 | Ht 66.0 in | Wt 164.0 lb

## 2021-09-05 DIAGNOSIS — Z01419 Encounter for gynecological examination (general) (routine) without abnormal findings: Secondary | ICD-10-CM

## 2021-09-05 MED ORDER — METRONIDAZOLE 0.75 % VA GEL: VAGINAL | 11 refills | Status: AC
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
## 2021-09-08 LAB — CYTOLOGY - PAP
Comment: NEGATIVE
Diagnosis: NEGATIVE
High risk HPV: NEGATIVE

## 2021-12-05 ENCOUNTER — Other Ambulatory Visit: Payer: Self-pay | Admitting: Orthopaedic Surgery

## 2021-12-05 DIAGNOSIS — M25571 Pain in right ankle and joints of right foot: Secondary | ICD-10-CM

## 2021-12-08 ENCOUNTER — Ambulatory Visit
Admission: RE | Admit: 2021-12-08 | Discharge: 2021-12-08 | Disposition: A | Discharge status: Home / Self Care | Payer: 59 | Source: Ambulatory Visit | Attending: Orthopaedic Surgery | Admitting: Orthopaedic Surgery

## 2021-12-08 DIAGNOSIS — M25571 Pain in right ankle and joints of right foot: Secondary | ICD-10-CM

## 2022-01-25 ENCOUNTER — Encounter (HOSPITAL_BASED_OUTPATIENT_CLINIC_OR_DEPARTMENT_OTHER): Payer: Self-pay | Admitting: Orthopaedic Surgery

## 2022-02-01 ENCOUNTER — Ambulatory Visit (HOSPITAL_BASED_OUTPATIENT_CLINIC_OR_DEPARTMENT_OTHER): Admission: RE | Admit: 2022-02-01 | Payer: 59 | Source: Home / Self Care | Admitting: Orthopaedic Surgery

## 2022-02-01 ENCOUNTER — Encounter (HOSPITAL_BASED_OUTPATIENT_CLINIC_OR_DEPARTMENT_OTHER): Admission: RE | Payer: Self-pay | Source: Home / Self Care

## 2022-02-01 SURGERY — OPEN REDUCTION INTERNAL FIXATION (ORIF) CALCANEOUS FRACTURE
Anesthesia: Choice | Laterality: Right
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
